# Patient Record
Sex: Female | Born: 1935 | Race: White | Hispanic: No | Smoking: Never smoker
Health system: Southern US, Community
[De-identification: ages and names within clinical notes are randomized; demographics above are authoritative.]

## PROBLEM LIST (undated history)

## (undated) DIAGNOSIS — N3020 Other chronic cystitis without hematuria: Secondary | ICD-10-CM

## (undated) DIAGNOSIS — M810 Age-related osteoporosis without current pathological fracture: Secondary | ICD-10-CM

## (undated) DIAGNOSIS — M5030 Other cervical disc degeneration, unspecified cervical region: Secondary | ICD-10-CM

## (undated) DIAGNOSIS — N289 Disorder of kidney and ureter, unspecified: Secondary | ICD-10-CM

## (undated) DIAGNOSIS — F419 Anxiety disorder, unspecified: Secondary | ICD-10-CM

## (undated) DIAGNOSIS — M170 Bilateral primary osteoarthritis of knee: Secondary | ICD-10-CM

## (undated) DIAGNOSIS — E785 Hyperlipidemia, unspecified: Secondary | ICD-10-CM

## (undated) DIAGNOSIS — E079 Disorder of thyroid, unspecified: Secondary | ICD-10-CM

## (undated) DIAGNOSIS — M329 Systemic lupus erythematosus, unspecified: Secondary | ICD-10-CM

## (undated) DIAGNOSIS — M797 Fibromyalgia: Secondary | ICD-10-CM

## (undated) DIAGNOSIS — M3500 Sicca syndrome, unspecified: Secondary | ICD-10-CM

## (undated) DIAGNOSIS — M436 Torticollis: Secondary | ICD-10-CM

## (undated) DIAGNOSIS — G459 Transient cerebral ischemic attack, unspecified: Secondary | ICD-10-CM

## (undated) DIAGNOSIS — I878 Other specified disorders of veins: Secondary | ICD-10-CM

## (undated) DIAGNOSIS — H409 Unspecified glaucoma: Secondary | ICD-10-CM

## (undated) DIAGNOSIS — J849 Interstitial pulmonary disease, unspecified: Secondary | ICD-10-CM

## (undated) DIAGNOSIS — K219 Gastro-esophageal reflux disease without esophagitis: Secondary | ICD-10-CM

## (undated) HISTORY — DX: Other chronic cystitis without hematuria: N30.20

## (undated) HISTORY — DX: Other specified disorders of veins: I87.8

## (undated) HISTORY — DX: Hyperlipidemia, unspecified: E78.5

## (undated) HISTORY — DX: Anxiety disorder, unspecified: F41.9

## (undated) HISTORY — DX: Age-related osteoporosis without current pathological fracture: M81.0

## (undated) HISTORY — DX: Unspecified glaucoma: H40.9

## (undated) HISTORY — DX: Fibromyalgia: M79.7

## (undated) HISTORY — DX: Disorder of thyroid, unspecified: E07.9

## (undated) HISTORY — DX: Bilateral primary osteoarthritis of knee: M17.0

## (undated) HISTORY — DX: Systemic lupus erythematosus, unspecified: M32.9

## (undated) HISTORY — DX: Interstitial pulmonary disease, unspecified: J84.9

## (undated) HISTORY — DX: Sjogren syndrome, unspecified: M35.00

## (undated) HISTORY — DX: Gastro-esophageal reflux disease without esophagitis: K21.9

## (undated) HISTORY — DX: Disorder of kidney and ureter, unspecified: N28.9

## (undated) HISTORY — DX: Other cervical disc degeneration, unspecified cervical region: M50.30

## (undated) HISTORY — DX: Transient cerebral ischemic attack, unspecified: G45.9

---

## 1980-04-28 HISTORY — PX: APPENDECTOMY: SHX54

## 1982-04-28 HISTORY — PX: ABDOMINAL HYSTERECTOMY: SHX81

## 1992-04-28 HISTORY — PX: LUMBAR DISC SURGERY: SHX700

## 2001-04-28 HISTORY — PX: LEEP: SHX91

## 2004-04-28 HISTORY — PX: TOTAL KNEE ARTHROPLASTY: SHX125

## 2008-02-11 ENCOUNTER — Encounter: Payer: Self-pay | Admitting: Internal Medicine

## 2008-02-27 ENCOUNTER — Encounter: Payer: Self-pay | Admitting: Internal Medicine

## 2009-04-28 HISTORY — PX: CYSTOSCOPY: SHX5120

## 2011-01-18 DIAGNOSIS — J984 Other disorders of lung: Secondary | ICD-10-CM | POA: Insufficient documentation

## 2011-01-18 DIAGNOSIS — M51369 Other intervertebral disc degeneration, lumbar region without mention of lumbar back pain or lower extremity pain: Secondary | ICD-10-CM | POA: Insufficient documentation

## 2011-01-18 DIAGNOSIS — M1990 Unspecified osteoarthritis, unspecified site: Secondary | ICD-10-CM | POA: Insufficient documentation

## 2011-01-18 DIAGNOSIS — M359 Systemic involvement of connective tissue, unspecified: Secondary | ICD-10-CM | POA: Insufficient documentation

## 2011-01-18 DIAGNOSIS — M3320 Polymyositis, organ involvement unspecified: Secondary | ICD-10-CM | POA: Insufficient documentation

## 2011-01-18 DIAGNOSIS — M5136 Other intervertebral disc degeneration, lumbar region: Secondary | ICD-10-CM | POA: Insufficient documentation

## 2011-12-23 DIAGNOSIS — M533 Sacrococcygeal disorders, not elsewhere classified: Secondary | ICD-10-CM | POA: Insufficient documentation

## 2012-03-04 DIAGNOSIS — N3020 Other chronic cystitis without hematuria: Secondary | ICD-10-CM | POA: Insufficient documentation

## 2012-03-04 DIAGNOSIS — R339 Retention of urine, unspecified: Secondary | ICD-10-CM | POA: Insufficient documentation

## 2012-03-08 ENCOUNTER — Ambulatory Visit: Payer: Self-pay | Admitting: Family Medicine

## 2012-04-01 ENCOUNTER — Ambulatory Visit: Payer: Self-pay | Admitting: Urology

## 2012-04-14 ENCOUNTER — Ambulatory Visit: Payer: Self-pay | Admitting: Family Medicine

## 2012-07-15 ENCOUNTER — Ambulatory Visit: Payer: Self-pay | Admitting: Family Medicine

## 2012-08-17 LAB — HM MAMMOGRAPHY: HM MAMMO: NEGATIVE

## 2013-05-19 ENCOUNTER — Observation Stay: Payer: Self-pay | Admitting: Internal Medicine

## 2013-05-19 LAB — CBC WITH DIFFERENTIAL/PLATELET
BASOS ABS: 0 10*3/uL (ref 0.0–0.1)
BASOS PCT: 0.5 %
EOS ABS: 0.3 10*3/uL (ref 0.0–0.7)
EOS PCT: 4.5 %
HCT: 39.2 % (ref 35.0–47.0)
HGB: 12.8 g/dL (ref 12.0–16.0)
Lymphocyte #: 1.9 10*3/uL (ref 1.0–3.6)
Lymphocyte %: 31.9 %
MCH: 31.3 pg (ref 26.0–34.0)
MCHC: 32.6 g/dL (ref 32.0–36.0)
MCV: 96 fL (ref 80–100)
MONO ABS: 0.4 x10 3/mm (ref 0.2–0.9)
Monocyte %: 7.6 %
NEUTROS PCT: 55.5 %
Neutrophil #: 3.3 10*3/uL (ref 1.4–6.5)
PLATELETS: 149 10*3/uL — AB (ref 150–440)
RBC: 4.08 10*6/uL (ref 3.80–5.20)
RDW: 13.8 % (ref 11.5–14.5)
WBC: 5.9 10*3/uL (ref 3.6–11.0)

## 2013-05-19 LAB — COMPREHENSIVE METABOLIC PANEL
ALBUMIN: 3.4 g/dL (ref 3.4–5.0)
ALT: 25 U/L (ref 12–78)
ANION GAP: 4 — AB (ref 7–16)
Alkaline Phosphatase: 109 U/L
BILIRUBIN TOTAL: 0.3 mg/dL (ref 0.2–1.0)
BUN: 34 mg/dL — ABNORMAL HIGH (ref 7–18)
CALCIUM: 8.8 mg/dL (ref 8.5–10.1)
CHLORIDE: 108 mmol/L — AB (ref 98–107)
CREATININE: 1 mg/dL (ref 0.60–1.30)
Co2: 29 mmol/L (ref 21–32)
EGFR (African American): >60
EGFR (Non-African Amer.): 55 — ABNORMAL LOW
Glucose: 75 mg/dL (ref 65–99)
Osmolality: 288 (ref 275–301)
Potassium: 4.3 mmol/L (ref 3.5–5.1)
SGOT(AST): 19 U/L (ref 15–37)
Sodium: 141 mmol/L (ref 136–145)
Total Protein: 6.7 g/dL (ref 6.4–8.2)

## 2013-05-19 LAB — CK-MB
CK-MB: 1.7 ng/mL (ref 0.5–3.6)
CK-MB: 1.8 ng/mL (ref 0.5–3.6)

## 2013-05-19 LAB — TROPONIN I: Troponin-I: <0.02 ng/mL

## 2013-05-20 LAB — BASIC METABOLIC PANEL
Anion Gap: 6 — ABNORMAL LOW (ref 7–16)
BUN: 30 mg/dL — ABNORMAL HIGH (ref 7–18)
CHLORIDE: 110 mmol/L — AB (ref 98–107)
CO2: 25 mmol/L (ref 21–32)
Calcium, Total: 8.6 mg/dL (ref 8.5–10.1)
Creatinine: 0.86 mg/dL (ref 0.60–1.30)
EGFR (African American): >60
GLUCOSE: 85 mg/dL (ref 65–99)
OSMOLALITY: 287 (ref 275–301)
Potassium: 3.5 mmol/L (ref 3.5–5.1)
Sodium: 141 mmol/L (ref 136–145)

## 2013-05-20 LAB — TSH: THYROID STIMULATING HORM: 0.76 u[IU]/mL

## 2013-05-20 LAB — HEMOGLOBIN A1C: HEMOGLOBIN A1C: 5.5 % (ref 4.2–6.3)

## 2013-05-20 LAB — LIPID PANEL
CHOLESTEROL: 156 mg/dL (ref 0–200)
HDL Cholesterol: 51 mg/dL (ref 40–60)
LDL CHOLESTEROL, CALC: 84 mg/dL (ref 0–100)
TRIGLYCERIDES: 105 mg/dL (ref 0–200)
VLDL Cholesterol, Calc: 21 mg/dL (ref 5–40)

## 2013-05-20 LAB — TROPONIN I: Troponin-I: <0.02 ng/mL

## 2013-05-20 LAB — CK-MB: CK-MB: 1.6 ng/mL (ref 0.5–3.6)

## 2013-05-21 LAB — LIPID PANEL
Cholesterol: 161 mg/dL (ref 0–200)
HDL Cholesterol: 53 mg/dL (ref 40–60)
Ldl Cholesterol, Calc: 95 mg/dL (ref 0–100)
Triglycerides: 67 mg/dL (ref 0–200)
VLDL CHOLESTEROL, CALC: 13 mg/dL (ref 5–40)

## 2013-07-08 DIAGNOSIS — M329 Systemic lupus erythematosus, unspecified: Secondary | ICD-10-CM | POA: Insufficient documentation

## 2013-07-08 DIAGNOSIS — M3500 Sicca syndrome, unspecified: Secondary | ICD-10-CM | POA: Insufficient documentation

## 2013-07-08 DIAGNOSIS — Z8673 Personal history of transient ischemic attack (TIA), and cerebral infarction without residual deficits: Secondary | ICD-10-CM | POA: Insufficient documentation

## 2013-07-08 DIAGNOSIS — N183 Chronic kidney disease, stage 3 unspecified: Secondary | ICD-10-CM | POA: Insufficient documentation

## 2013-07-18 LAB — LIPID PANEL
Cholesterol: 194 mg/dL (ref 0–200)
HDL: 69 mg/dL (ref 35–70)
LDL Cholesterol: 105 mg/dL
TRIGLYCERIDES: 99 mg/dL (ref 40–160)

## 2013-10-20 ENCOUNTER — Other Ambulatory Visit: Payer: Self-pay | Admitting: Physical Medicine and Rehabilitation

## 2013-10-20 DIAGNOSIS — M5414 Radiculopathy, thoracic region: Secondary | ICD-10-CM | POA: Insufficient documentation

## 2013-10-20 DIAGNOSIS — M7062 Trochanteric bursitis, left hip: Secondary | ICD-10-CM | POA: Insufficient documentation

## 2013-10-20 DIAGNOSIS — M545 Low back pain: Secondary | ICD-10-CM

## 2013-11-01 ENCOUNTER — Other Ambulatory Visit: Payer: Self-pay

## 2013-11-03 ENCOUNTER — Ambulatory Visit
Admission: RE | Admit: 2013-11-03 | Discharge: 2013-11-03 | Disposition: A | Discharge status: Home / Self Care | Payer: Medicare Other | Source: Ambulatory Visit | Attending: Physical Medicine and Rehabilitation | Admitting: Physical Medicine and Rehabilitation

## 2013-11-03 DIAGNOSIS — M545 Low back pain: Secondary | ICD-10-CM

## 2013-11-10 DIAGNOSIS — I779 Disorder of arteries and arterioles, unspecified: Secondary | ICD-10-CM | POA: Insufficient documentation

## 2014-06-26 DIAGNOSIS — I6529 Occlusion and stenosis of unspecified carotid artery: Secondary | ICD-10-CM | POA: Insufficient documentation

## 2014-06-26 DIAGNOSIS — R0681 Apnea, not elsewhere classified: Secondary | ICD-10-CM | POA: Insufficient documentation

## 2014-08-19 NOTE — H&P (Signed)
PATIENT NAME:  Neena RhymesDOLLAR, Santina MR#:  [id number] DATE OF BIRTH:  Mar 16, 1935  DATE OF ADMISSION:  05/19/2013  PRIMARY CARE PHYSICIAN: Dr. Lorie PhenixNancy Maloney.   REFERRING PHYSICIAN: Dr. Darnelle CatalanMalinda.   CHIEF COMPLAINT: Right facial droop.,   HISTORY OF PRESENT ILLNESS: Ms. Cleophas DunkerDollar is a 80 year old female with a history of Sjogren's disease, lupus, osteoarthritis. Was noted to have right facial droop when she woke up this morning. Denied having any dysarthria, difficulty swallowing, ptosis. Denied having any weakness in any part of the body. Concerning this, called the primary care physician and from there came to the Emergency Department. Workup in the Emergency Department, CT head without contrast no acute intracranial abnormality. Showed sinusitis. The patient denied previous history of any TIAs. The patient states symptoms are completely resolved at this time.   PAST MEDICAL HISTORY:  1. Sjogren's disease.  2. Glaucoma.  3. Osteoarthritis.  4. Lupus.   PAST SURGICAL HISTORY:  1. Hysterectomy.  2. Appendectomy.  3. Lower back surgery.  4. Bilateral knee replacement.   ALLERGIES:  1. SHELLFISH.  2. SULFA.  3. CORTISONE.  4. FLONASE.   HOME MEDICATIONS:  1. Xanax 0.5 mg once a day.  2. Vitamin D3 1000 units 2 times a day.  3. Vitamin B6 100 mg once a day.  4. Vitamin B 12,000 mcg once a day.  5. Tylenol Arthritis 2 tablets every 8 hours.  6. Tramadol 50 mg 1 tablet every 4 to 6 hours as needed.  7. Synthroid 88 mcg once a day.  8. Spironolactone 50 mg once a day.  9. Plaquenil 200 mg 2 tablets once a day.  10. Omega-3 fatty acids 2 times a day.  11. Nexium 40 mg once a day.  12. Multivitamin 1 tablet once a day.  13. Lexapro 5 mg once a day.  14. Latanoprost 1 drop once a day.  15. Klonopin 0.5 mg 2 times a day.  16. Glucosamine chondroitin 1 tablet 2 times a day.  17.  1 drop eye.  18. Doxepin 50 mg a day.  19. Corgard 20 mg 2 times a day.  20. Coenzyme Q orally once a day.  21.  Calcium with vitamin D 2 times a day.  22. Biotin 5000 mcg orally.  23. Aspirin 81 mg daily.  24. Acidophilus probiotic once a day.   SOCIAL HISTORY: No history of smoking, drinking alcohol or using illicit drugs. Married, lives with her husband.   FAMILY HISTORY: Osteoarthritis, CVA and hypertension in mother. Father had hypertension,   REVIEW OF SYSTEMS:  CONSTITUTIONAL: Denies generalized weakness.  EYES: No change in vision.  EARS, NOSE, THROAT: Has sinus congestion. No sore throat.  RESPIRATORY: No cough, shortness of breath.  CARDIOVASCULAR: No chest pain, palpitations.  GASTROINTESTINAL: No nausea, vomiting, abdominal pain.  GENITOURINARY: No dysuria or hematuria.   ENDOCRINE: No polyuria or polydipsia.  HEMATOLOGIC: No easy bruising or bleeding.  MUSCULOSKELETAL: Has osteoarthritis.  NEUROLOGIC: No weakness at present, numbness in any part of the body.   PHYSICAL EXAMINATION:  GENERAL: This is a well-built, well-nourished, age-appropriate female lying down in the bed, not in distress.  VITAL SIGNS: Temperature 98.2, pulse 73, blood pressure 131/58, respiratory rate of 16, oxygen saturation is 95% on room air.  HEENT: Head normocephalic, atraumatic. There is no scleral icterus. Conjunctivae normal. Pupils equal and reactive to light. Extraocular movements are intact. Mucous membranes moist. No pharyngeal erythema.  NECK: Supple. No lymphadenopathy. No JVD. No carotid bruit. No thyromegaly.  CHEST: Has  no focal tenderness.  LUNGS: Bilaterally clear to auscultation.  HEART: S1, S2 regular. No murmurs are heard.  ABDOMEN: Bowel sounds present. Soft, nontender, nondistended. No hepatosplenomegaly.  EXTREMITIES: No pedal edema. Pulses 2+.  NEUROLOGIC: The patient is alert, oriented to place, person and time. Cranial nerves II through XII intact. Motor 5/5 in upper and lower extremities.  SKIN: No rash or lesions.  MUSCULOSKELETAL: Has osteoarthritis.   LABORATORIES: CBC, CMP are  completely within normal limits.   CT head without contrast: No acute intracranial abnormality. Complete opacification of the sphenoid sinus.   ASSESSMENT AND PLAN: Ms. Belles is a 80 year old female who comes to the Emergency Department with right facial droop,  1. Transient ischemic attack: The patient has risk factors of age, systemic inflammatory disease. Admit the patient to a monitored bed under observation. Obtain MRI of the brain, carotid Dopplers and echocardiogram. The patient is on aspirin 81 mg daily. Will keep the patient on 325 mg daily.  2. Sjogren's disease: Will continue with the Plaquenil.  3. Sinusitis: The patient does not have any fever. No current indication for any antibiotics.  4. Hypothyroidism: Continue Synthroid.  5. Keep the patient on deep vein thrombosis prophylaxis with Lovenox.   TIME SPENT: 40 minutes.    ____________________________ Susa Griffins, MD pv:gb D: 05/19/2013 21:41:31 ET T: 05/19/2013 22:30:05 ET JOB#: [id number]  cc: Susa Griffins, MD, <Dictator> Leo Grosser, MD Susa Griffins MD ELECTRONICALLY SIGNED 05/22/2013 21:26

## 2014-08-19 NOTE — Discharge Summary (Signed)
PATIENT NAME:  Mckenzie Wiley, Mckenzie Wiley MR#:  [id number] DATE OF BIRTH:  [birth year]  DATE OF ADMISSION:  05/19/2013 DATE OF DISCHARGE:  05/21/2013  DISCHARGE DIAGNOSES: 1.  Facial droop, resolved.  2.  Hypertension.  3.  Sjogren's syndrome.   4.  Depression.  5.  Anxiety.   DISCHARGE MEDICATIONS: 1.  Lexapro 10 mg half tablet daily.  2.  Plaquenil 200 mg 2 tablets once a day.  3.  Synthroid 88 mcg p.o. daily.  4.  Corgard 20 mg p.o. b.i.d.  5.  Aldactone 50 mg p.o. daily.  6.  Tramadol 50 mg p.o. every 4 to 6 hours for pain.  7.  Klonopin 0.5 mg p.o. b.i.d.  8.  Xanax 0.5 mg once a day.  9.   doxepin 50mg   once a day.  10.  Nexium 40 mg p.o. daily.  11.  Latanoprost eye drop 0.05% 1 drop in each eye.  12.  Calcium with vitamin D 1 tablet p.o. b.i.d.  13.  Acidophilus probiotic 1 capsule p.o. daily.  14.  Glucosamine/chondroitin sulfate 1 tablet p.o. b.i.d.  15.  Vitamin B6, 100 mg p.o. daily.  16.  Omega-3 polyunsaturated fat 1 capsule p.o. b.i.d.  17.  Simvastatin 10 mg p.o. daily; it is a new medication.  18.  She is also on PreserVision eye drops, 1 drop 4 times a day.   DIET: Low-sodium, low-fat diet.   CONSULTATIONS: None.   HOSPITAL COURSE:  1.  Facial droop. A 80 year old female patient admitted on 22nd of January because of her right facial droop. The patient did not have dysarthria or difficulty swallowing. Has a history of Sjogren's syndrome, lupus and osteoarthritis. The patient noticed the facial droop when she woke up in the morning. Look at the history and physical for full details. CT head did not show any acute changes. The patient admitted to observation status for possible TIA. The patient's blood pressure on admission 131/58, and neurological exam was within normal limits. The patient was admitted to telemetry. She had echocardiogram, carotid ultrasound, MRI of the brain. The patient was started on aspirin 325 mg p.o. daily. Her troponins have been negative. MRA of the  brain did not show any acute changes, and ultrasound of carotids showed minor carotid atherosclerotic  changes, but no significant ICA stenosis. Echocardiogram showed EF of 55% to 60% with normal systolic function. The patient's LDL is 95. The patient's other labs are within normal range. Because of her facial droop, which resolved, we started her on cholesterol medication, also because of her carotid plaque, so she is on aspirin and statins and neurological exam was within normal limits at the time of discharge.  2.  Hypertension. Blood pressure is stable, and the patient takes Coreg and also spironolactone which we continued.  3.  The patient has a history of anxiety, hypothyroidism. We have continued all of her medications for her.   TIME SPENT ON DISCHARGE PREPARATION: More than 30 minutes.   The patient can follow up with Dr. Lorie PhenixNancy Maloney.     ____________________________ Katha HammingSnehalatha Nasri Boakye, MD sk:dmm D: 05/23/2013 10:34:00 ET T: 05/23/2013 11:57:26 ET JOB#: [id number]  cc: Katha HammingSnehalatha Kiondra Caicedo, MD, <Dictator> Katha HammingSNEHALATHA Treniece Holsclaw MD ELECTRONICALLY SIGNED 06/06/2013 13:12

## 2014-09-04 ENCOUNTER — Ambulatory Visit
Admission: RE | Admit: 2014-09-04 | Discharge: 2014-09-04 | Disposition: A | Discharge status: Home / Self Care | Payer: Medicare Other | Source: Ambulatory Visit | Attending: Family Medicine | Admitting: Family Medicine

## 2014-09-04 ENCOUNTER — Other Ambulatory Visit: Payer: Self-pay | Admitting: Family Medicine

## 2014-09-04 DIAGNOSIS — R05 Cough: Secondary | ICD-10-CM | POA: Insufficient documentation

## 2014-09-04 DIAGNOSIS — R059 Cough, unspecified: Secondary | ICD-10-CM

## 2014-10-19 ENCOUNTER — Other Ambulatory Visit: Payer: Self-pay

## 2014-10-19 MED ORDER — ESCITALOPRAM OXALATE 10 MG PO TABS: ORAL_TABLET | ORAL | Status: DC

## 2014-10-20 ENCOUNTER — Other Ambulatory Visit: Payer: Self-pay | Admitting: *Deleted

## 2014-10-20 MED ORDER — OMEPRAZOLE 20 MG PO CPDR: 20.0000 mg | DELAYED_RELEASE_CAPSULE | Freq: Every day | ORAL | Status: DC

## 2014-10-20 NOTE — Telephone Encounter (Signed)
Refill request for Omeprazole 20 mg This med is not in [facility]. Last Appt: 09/04/2014 Next Appt: none Please advise refill?

## 2014-10-24 ENCOUNTER — Telehealth: Payer: Self-pay | Admitting: Family Medicine

## 2014-10-24 ENCOUNTER — Other Ambulatory Visit: Payer: Self-pay | Admitting: Family Medicine

## 2014-10-24 DIAGNOSIS — F419 Anxiety disorder, unspecified: Secondary | ICD-10-CM | POA: Insufficient documentation

## 2014-10-24 MED ORDER — CLONAZEPAM 0.5 MG PO TABS: 0.5000 mg | ORAL_TABLET | Freq: Two times a day (BID) | ORAL | Status: DC | PRN

## 2014-10-24 NOTE — Telephone Encounter (Signed)
Please call in rx.  Thanks.  

## 2014-11-16 ENCOUNTER — Other Ambulatory Visit: Payer: Self-pay | Admitting: Family Medicine

## 2014-11-16 DIAGNOSIS — I10 Essential (primary) hypertension: Secondary | ICD-10-CM | POA: Insufficient documentation

## 2014-11-28 ENCOUNTER — Telehealth: Payer: Self-pay | Admitting: Family Medicine

## 2014-11-28 DIAGNOSIS — F419 Anxiety disorder, unspecified: Secondary | ICD-10-CM

## 2014-11-28 MED ORDER — CLONAZEPAM 0.5 MG PO TABS: 0.5000 mg | ORAL_TABLET | Freq: Two times a day (BID) | ORAL | Status: DC | PRN

## 2014-11-28 NOTE — Telephone Encounter (Signed)
Patient called wanting to know why her prescription was changed for her Klonopin.  She was taking two time dailey but changed to once a day.   Please call her back.  Thanks, Barth Kirks

## 2014-11-28 NOTE — Telephone Encounter (Signed)
Ok to call in 70. Thanks.

## 2014-11-28 NOTE — Telephone Encounter (Signed)
Called in medication as below. Advised patient.

## 2014-12-19 ENCOUNTER — Other Ambulatory Visit: Payer: Self-pay | Admitting: Family Medicine

## 2014-12-19 DIAGNOSIS — I10 Essential (primary) hypertension: Secondary | ICD-10-CM

## 2014-12-19 NOTE — Telephone Encounter (Signed)
Last OV 08/2014  Thanks,   -Ancil Dewan  

## 2015-01-12 ENCOUNTER — Encounter: Payer: Self-pay | Admitting: Family Medicine

## 2015-01-17 ENCOUNTER — Other Ambulatory Visit: Payer: Self-pay | Admitting: Family Medicine

## 2015-01-17 DIAGNOSIS — E7800 Pure hypercholesterolemia, unspecified: Secondary | ICD-10-CM | POA: Insufficient documentation

## 2015-01-17 DIAGNOSIS — M5030 Other cervical disc degeneration, unspecified cervical region: Secondary | ICD-10-CM | POA: Insufficient documentation

## 2015-01-17 DIAGNOSIS — E039 Hypothyroidism, unspecified: Secondary | ICD-10-CM | POA: Insufficient documentation

## 2015-01-17 NOTE — Telephone Encounter (Signed)
Last OV 08/2014  Thanks,   -Laura  

## 2015-01-18 ENCOUNTER — Other Ambulatory Visit: Payer: Self-pay | Admitting: *Deleted

## 2015-01-18 DIAGNOSIS — K219 Gastro-esophageal reflux disease without esophagitis: Secondary | ICD-10-CM | POA: Insufficient documentation

## 2015-01-18 MED ORDER — OMEPRAZOLE 20 MG PO CPDR: 20.0000 mg | DELAYED_RELEASE_CAPSULE | Freq: Every day | ORAL | Status: DC

## 2015-01-18 NOTE — Telephone Encounter (Signed)
Last 09/04/2014   Thanks,   Vernona Rieger

## 2015-01-24 ENCOUNTER — Other Ambulatory Visit: Payer: Self-pay

## 2015-01-24 DIAGNOSIS — K219 Gastro-esophageal reflux disease without esophagitis: Secondary | ICD-10-CM

## 2015-02-09 DIAGNOSIS — R002 Palpitations: Secondary | ICD-10-CM | POA: Insufficient documentation

## 2015-02-15 ENCOUNTER — Other Ambulatory Visit: Payer: Self-pay | Admitting: Family Medicine

## 2015-02-15 DIAGNOSIS — K219 Gastro-esophageal reflux disease without esophagitis: Secondary | ICD-10-CM

## 2015-02-15 NOTE — Telephone Encounter (Signed)
Last ov 08/2014  Thanks,   -Korah Hufstedler  

## 2015-04-07 ENCOUNTER — Other Ambulatory Visit: Payer: Self-pay | Admitting: Family Medicine

## 2015-04-07 DIAGNOSIS — F419 Anxiety disorder, unspecified: Secondary | ICD-10-CM

## 2015-04-09 NOTE — Telephone Encounter (Signed)
Printed, please fax or call in to pharmacy. Thank you.   

## 2015-05-17 ENCOUNTER — Other Ambulatory Visit: Payer: Self-pay | Admitting: Family Medicine

## 2015-05-17 DIAGNOSIS — F419 Anxiety disorder, unspecified: Secondary | ICD-10-CM

## 2015-05-17 DIAGNOSIS — I10 Essential (primary) hypertension: Secondary | ICD-10-CM

## 2015-05-18 NOTE — Telephone Encounter (Signed)
Printed, please fax or call in to pharmacy. Thank you.   

## 2015-05-18 NOTE — Telephone Encounter (Signed)
RX called in at [facility].

## 2015-05-23 ENCOUNTER — Ambulatory Visit (INDEPENDENT_AMBULATORY_CARE_PROVIDER_SITE_OTHER): Payer: Medicare Other | Admitting: Physician Assistant

## 2015-05-23 ENCOUNTER — Encounter: Payer: Self-pay | Admitting: Physician Assistant

## 2015-05-23 VITALS — BP 120/66 | HR 80 | Temp 98.0°F | Resp 16 | Wt 195.2 lb

## 2015-05-23 DIAGNOSIS — I878 Other specified disorders of veins: Secondary | ICD-10-CM | POA: Insufficient documentation

## 2015-05-23 DIAGNOSIS — L309 Dermatitis, unspecified: Secondary | ICD-10-CM | POA: Insufficient documentation

## 2015-05-23 DIAGNOSIS — L03019 Cellulitis of unspecified finger: Secondary | ICD-10-CM | POA: Insufficient documentation

## 2015-05-23 DIAGNOSIS — F329 Major depressive disorder, single episode, unspecified: Secondary | ICD-10-CM | POA: Insufficient documentation

## 2015-05-23 DIAGNOSIS — N390 Urinary tract infection, site not specified: Secondary | ICD-10-CM

## 2015-05-23 DIAGNOSIS — F32A Depression, unspecified: Secondary | ICD-10-CM | POA: Insufficient documentation

## 2015-05-23 DIAGNOSIS — R011 Cardiac murmur, unspecified: Secondary | ICD-10-CM

## 2015-05-23 DIAGNOSIS — J0141 Acute recurrent pansinusitis: Secondary | ICD-10-CM | POA: Diagnosis not present

## 2015-05-23 DIAGNOSIS — M3500 Sicca syndrome, unspecified: Secondary | ICD-10-CM | POA: Insufficient documentation

## 2015-05-23 DIAGNOSIS — H409 Unspecified glaucoma: Secondary | ICD-10-CM | POA: Insufficient documentation

## 2015-05-23 DIAGNOSIS — IMO0002 Reserved for concepts with insufficient information to code with codable children: Secondary | ICD-10-CM | POA: Insufficient documentation

## 2015-05-23 LAB — POCT URINALYSIS DIPSTICK
Bilirubin, UA: NEGATIVE
Blood, UA: NEGATIVE
Glucose, UA: NEGATIVE
Ketones, UA: NEGATIVE
LEUKOCYTES UA: NEGATIVE
NITRITE UA: NEGATIVE
PH UA: 5
PROTEIN UA: NEGATIVE
Spec Grav, UA: >=1.03
UROBILINOGEN UA: 0.2

## 2015-05-23 MED ORDER — AMOXICILLIN-POT CLAVULANATE 875-125 MG PO TABS: 1.0000 | ORAL_TABLET | Freq: Two times a day (BID) | ORAL | Status: DC

## 2015-05-23 NOTE — Patient Instructions (Signed)
Sinusitis, Adult Sinusitis is redness, soreness, and inflammation of the paranasal sinuses. Paranasal sinuses are air pockets within the bones of your face. They are located beneath your eyes, in the middle of your forehead, and above your eyes. In healthy paranasal sinuses, mucus is able to drain out, and air is able to circulate through them by way of your nose. However, when your paranasal sinuses are inflamed, mucus and air can become trapped. This can allow bacteria and other germs to grow and cause infection. Sinusitis can develop quickly and last only a short time (acute) or continue over a long period (chronic). Sinusitis that lasts for more than 12 weeks is considered chronic. CAUSES Causes of sinusitis include:  Allergies.  Structural abnormalities, such as displacement of the cartilage that separates your nostrils (deviated septum), which can decrease the air flow through your nose and sinuses and affect sinus drainage.  Functional abnormalities, such as when the small hairs (cilia) that line your sinuses and help remove mucus do not work properly or are not present. SIGNS AND SYMPTOMS Symptoms of acute and chronic sinusitis are the same. The primary symptoms are pain and pressure around the affected sinuses. Other symptoms include:  Upper toothache.  Earache.  Headache.  Bad breath.  Decreased sense of smell and taste.  A cough, which worsens when you are lying flat.  Fatigue.  Fever.  Thick drainage from your nose, which often is green and may contain pus (purulent).  Swelling and warmth over the affected sinuses. DIAGNOSIS Your health care provider will perform a physical exam. During your exam, your health care provider may perform any of the following to help determine if you have acute sinusitis or chronic sinusitis:  Look in your nose for signs of abnormal growths in your nostrils (nasal polyps).  Tap over the affected sinus to check for signs of  infection.  View the inside of your sinuses using an imaging device that has a light attached (endoscope). If your health care provider suspects that you have chronic sinusitis, one or more of the following tests may be recommended:  Allergy tests.  Nasal culture. A sample of mucus is taken from your nose, sent to a lab, and screened for bacteria.  Nasal cytology. A sample of mucus is taken from your nose and examined by your health care provider to determine if your sinusitis is related to an allergy. TREATMENT Most cases of acute sinusitis are related to a viral infection and will resolve on their own within 10 days. Sometimes, medicines are prescribed to help relieve symptoms of both acute and chronic sinusitis. These may include pain medicines, decongestants, nasal steroid sprays, or saline sprays. However, for sinusitis related to a bacterial infection, your health care provider will prescribe antibiotic medicines. These are medicines that will help kill the bacteria causing the infection. Rarely, sinusitis is caused by a fungal infection. In these cases, your health care provider will prescribe antifungal medicine. For some cases of chronic sinusitis, surgery is needed. Generally, these are cases in which sinusitis recurs more than 3 times per year, despite other treatments. HOME CARE INSTRUCTIONS  Drink plenty of water. Water helps thin the mucus so your sinuses can drain more easily.  Use a humidifier.  Inhale steam 3-4 times a day (for example, sit in the bathroom with the shower running).  Apply a warm, moist washcloth to your face 3-4 times a day, or as directed by your health care provider.  Use saline nasal sprays to help   moisten and clean your sinuses.  Take medicines only as directed by your health care provider.  If you were prescribed either an antibiotic or antifungal medicine, finish it all even if you start to feel better. SEEK IMMEDIATE MEDICAL CARE IF:  You have  increasing pain or severe headaches.  You have nausea, vomiting, or drowsiness.  You have swelling around your face.  You have vision problems.  You have a stiff neck.  You have difficulty breathing.   This information is not intended to replace advice given to you by your health care provider. Make sure you discuss any questions you have with your health care provider.   Document Released: 04/14/2005 Document Revised: 05/05/2014 Document Reviewed: 04/29/2011 Elsevier Interactive Patient Education 2016 [employer]  Aortic Stenosis Aortic stenosis is a narrowing of the aortic valve. The aortic valve is a gate-like structure that is located between the lower left chamber of the heart (left ventricle) and the blood vessel that leads away from the heart (aorta). When the aortic valve is narrowed, it does not open all the way. This makes it hard for the heart to pump blood into the aorta and causes the heart to work harder. The extra work can weaken the heart over time and lead to heart failure. CAUSES  Causes of aortic stenosis include:  Calcium deposits on the aortic valve that have made the valve stiff. This condition generally affects those over the age of 19. It is the most common cause of aortic stenosis.  A birth defect.  Rheumatic fever. This is a problem that may occur after a strep throat infection that was not treated adequately. Rheumatic fever can cause permanent damage to heart valves. SIGNS AND SYMPTOMS  People with aortic stenosis usually have no symptoms until the condition becomes severe. It may take 10-20 years for mild or moderate aortic stenosis to become severe. Symptoms may include:   Shortness of breath, especially with physical activity.   Feeling weak and tired (fatigued) or getting tired easily.  Chest discomfort (angina). This may occur with minimal activity if the aortic stenosis is severe.  An irregular or faster-than-normal heartbeat.  Dizziness  or fainting that happens with exertion or after taking certain heart medicines (such as nitroglycerin). DIAGNOSIS  Aortic stenosis is usually diagnosed with a physical exam and with a type of imaging test called echocardiography. During echocardiography, sound waves are used to evaluate how blood flows through the heart. If your health care provider suspects aortic stenosis but the test does not clearly show it, a procedure called cardiac catheterization may be done to diagnose the condition. Tests may also be done to evaluate heart function. They may include:  Electrocardiography. During this test, the electrical impulses of the heart are recorded while you are lying down and sticky patches are placed on your chest, arms, and legs.  Stress tests. There is more than one type of stress test. If a stress test is needed, ask your health care provider about which type is best for you.  Blood tests. TREATMENT  Treatment depends on how severe the aortic stenosis is, your symptoms, and the problems it is causing.   Observation. If the aortic stenosis is mild, no treatment may be needed. However, you will need to have the condition checked regularly to make sure it is not getting worse or causing serious problems.  Surgery. Surgery to repair or replace the aortic valve is the most common treatment for aortic stenosis. Several types of surgeries are  available. The most common are open-heart surgery and transcutaneous aortic valve replacement (TAVR). TAVR does not require that the chest be opened. It is usually performed on elderly patients and those who are not able to have open-heart surgery.  Medicines. Medicines may be given to keep symptoms from getting worse. Medicines cannot reverse aortic stenosis. HOME CARE INSTRUCTIONS   You may need to avoid certain types of physical activity. If your aortic stenosis is mild, you may need to avoid only strenuous activity. The more severe your aortic stenosis, the  more activities you will need to avoid. Talk with your health care provider about the types of activity you should avoid.  Take medicines only as directed by your health care provider.  If you are a woman with aortic valve stenosis and want to get pregnant, talk to your health care provider before you become pregnant.  If you are a woman with aortic valve stenosis and are pregnant, keep all follow-up visits with all recommended health care providers.  Keep all follow-up visits for tests, exams, and treatments as directed by your health care provider. SEEK IMMEDIATE MEDICAL CARE IF:  You develop chest pain or tightness.   You develop shortness of breath or difficulty breathing.   You develop light-headedness or faint.   It feels like your heartbeat is irregular or faster than normal.  You have a fever.   This information is not intended to replace advice given to you by your health care provider. Make sure you discuss any questions you have with your health care provider.   Document Released: 01/11/2003 Document Revised: 01/03/2015 Document Reviewed: 04/08/2012 Elsevier Interactive Patient Education [employer]

## 2015-05-23 NOTE — Progress Notes (Signed)
Patient: Mckenzie Wiley Female    DOB: January 23, 1935   81 y.o.   MRN: [id number] Visit Date: 05/23/2015  Today's Provider: Margaretann Loveless, PA-C   Chief Complaint  Patient presents with  . URI   Subjective:    URI  This is a new problem. The current episode started 1 to 4 weeks ago. There has been no fever. Associated symptoms include congestion, coughing, headaches, rhinorrhea, sinus pain, sneezing, a sore throat and wheezing. Pertinent negatives include no chest pain or nausea. She has tried nothing for the symptoms.  Urinary Tract Infection  This is a new problem. The current episode started 1 to 4 weeks ago. The problem has been gradually worsening. There has been no fever. She is not sexually active. Associated symptoms include chills and hesitancy. Pertinent negatives include no frequency, hematuria, nausea or urgency. She has tried antibiotics (Cipro) for the symptoms. The treatment provided no relief.  Patient has an appointment with the Urologist February the 3rd for frequent UTI.     Allergies  Allergen Reactions  . Cortisone     Other reaction(s): Other (See Comments) Other Reaction: OCULAR PRESSURE INCREASES  . Loteprednol Etabonate     Other reaction(s): Other (See Comments) Burned eyes and eye lids Other reaction(s): EYE IRRITATION Other reaction(s): Other (See Comments) Burned eyes and eye lids  . Sulfur Swelling    Also nausea  . Fluticasone     Other reaction(s): Other (See Comments) Other Reaction: raised pressure of eyes  . Lovastatin     Other reaction(s): Muscle Pain Aches   . Montelukast     Other reaction(s): Other (See Comments) Other Reaction: TACHYCARDIA  . Olopatadine     Other reaction(s): Other (See Comments) Other Reaction: VERY DRY EYES  . Prednisone     Other reaction(s): Other (See Comments) Other Reaction: INCREASED OCULAR PRESSURE  . Shellfish Allergy     swelling and nausea  . Simvastatin     Other reaction(s): Muscle  Pain aches  . Sulfa Antibiotics     swollen face  . Codeine Nausea Only   Previous Medications   ACETAMINOPHEN (TYLENOL) 500 MG TABLET    Take 650 mg by mouth.   ALPRAZOLAM (XANAX) 0.5 MG TABLET    TAKE 1 TABLET BY MOUTH 3 TIMES DAILY AS NEEDED. **SANDOZ ONLY**   ASPIRIN 325 MG TABLET    Take by mouth.   BIOTIN (SUPER BIOTIN) 5 MG TABS    Take by mouth.   CALCIUM CARBONATE-VITAMIN D 600-200 MG-UNIT CAPS    Take by mouth.   CLONAZEPAM (KLONOPIN) 0.5 MG TABLET    TAKE 1 TABLET BY MOUTH TWICE DAILY AS NEEDED FOR ANXIETY   COENZYME Q10 (CO Q 10) 100 MG CAPS    Take by mouth.   CYANOCOBALAMIN 100 MCG TABLET    Take by mouth.   DOXEPIN (SINEQUAN) 50 MG CAPSULE    Take by mouth.   ESCITALOPRAM (LEXAPRO) 10 MG TABLET    2 tablets daily   ESOMEPRAZOLE (NEXIUM) 40 MG PACKET    Take by mouth.   HYDROXYCHLOROQUINE (PLAQUENIL) 200 MG TABLET    Take by mouth.   LACTOBACILLUS (PROBIOTIC ACIDOPHILUS) TABS    Take by mouth.   LATANOPROST (XALATAN) 0.005 % OPHTHALMIC SOLUTION    Apply to eye.   LEVOTHYROXINE (SYNTHROID, LEVOTHROID) 88 MCG TABLET    TAKE 1 TABLET BY MOUTH ONCE DAILY ON AN EMPTY STOMACH. WAIT 30 MINUTES BEFORE TAKING OTHER MEDS.  OMEPRAZOLE (PRILOSEC) 20 MG CAPSULE    TAKE ONE CAPSULE BY MOUTH ONCE A DAY.   PRAVASTATIN (PRAVACHOL) 10 MG TABLET       PROPRANOLOL (INDERAL) 20 MG TABLET    TAKE 1 TABLET BY MOUTH TWICE DAILY.   PYRIDOXINE (VITAMIN B-6) 100 MG TABLET    Take by mouth.    Review of Systems  Constitutional: Positive for chills.  HENT: Positive for congestion, nosebleeds, postnasal drip, rhinorrhea, sinus pressure, sneezing and sore throat.   Respiratory: Positive for cough, chest tightness, shortness of breath and wheezing.   Cardiovascular: Positive for palpitations. Negative for chest pain and leg swelling.  Gastrointestinal: Negative for nausea.  Genitourinary: Positive for hesitancy. Negative for urgency, frequency and hematuria.  Neurological: Positive for headaches.  Negative for dizziness.    Social History  Substance Use Topics  . Smoking status: Never Smoker   . Smokeless tobacco: Not on file  . Alcohol Use: No   Objective:   BP 120/66 mmHg  Pulse 80  Temp(Src) 98 F (36.7 C) (Oral)  Resp 16  Wt 195 lb 3.2 oz (88.542 kg)  SpO2 95%  Physical Exam  Constitutional: She appears well-developed and well-nourished. No distress.  HENT:  Head: Normocephalic and atraumatic.  Right Ear: Hearing, tympanic membrane, external ear and ear canal normal.  Left Ear: Hearing, tympanic membrane, external ear and ear canal normal.  Nose: Mucosal edema and rhinorrhea present. Right sinus exhibits maxillary sinus tenderness and frontal sinus tenderness. Left sinus exhibits maxillary sinus tenderness and frontal sinus tenderness.  Mouth/Throat: Uvula is midline, oropharynx is clear and moist and mucous membranes are normal. No oropharyngeal exudate, posterior oropharyngeal edema or posterior oropharyngeal erythema.  Eyes: Conjunctivae are normal. Pupils are equal, round, and reactive to light. Right eye exhibits no discharge. Left eye exhibits no discharge. No scleral icterus.  Neck: Normal range of motion. Neck supple. No tracheal deviation present. No thyromegaly present.  Cardiovascular: Normal rate and regular rhythm.  Exam reveals no gallop and no friction rub.   Murmur heard.  Systolic murmur is present with a grade of 2/6  Heard best over right second intercostal space.  Pulmonary/Chest: Effort normal and breath sounds normal. No stridor. No respiratory distress. She has no wheezes. She has no rales.  Abdominal: Soft. Bowel sounds are normal. She exhibits no distension and no mass. There is no tenderness. There is no rebound and no guarding.  Lymphadenopathy:    She has no cervical adenopathy.  Skin: Skin is warm and dry. She is not diaphoretic.  Vitals reviewed.       Assessment & Plan:     1. Urinary tract infection without hematuria, site  unspecified UA was negative for UTI. She is to keep her appointment with urology on February 3 for further evaluation. - POCT urinalysis dipstick  2. Undiagnosed cardiac murmurs On exam she did have a murmur best heard over the right second intercostal space. This is most likely aortic stenosis however she states she has never been told she has a murmur. It is most likely benign but I did advise her that it would be best to have a cardiac evaluation to rule out any worse cause. - Ambulatory referral to Cardiology  3. Acute recurrent pansinusitis Worsening symptoms that have failed over-the-counter treatment. I will treat with Augmentin as below. She needs to make sure to stay well-hydrated and get plenty of rest. She is to call the office if symptoms felt to improve or worsen. - amoxicillin-clavulanate (  AUGMENTIN) 875-125 MG tablet; Take 1 tablet by mouth 2 (two) times daily.  Dispense: 20 tablet; Refill: 0       Margaretann Loveless, PA-C  [facility]

## 2015-06-18 ENCOUNTER — Other Ambulatory Visit: Payer: Self-pay | Admitting: Family Medicine

## 2015-06-26 DIAGNOSIS — Z9660 Presence of unspecified orthopedic joint implant: Secondary | ICD-10-CM | POA: Insufficient documentation

## 2015-06-26 DIAGNOSIS — M797 Fibromyalgia: Secondary | ICD-10-CM | POA: Insufficient documentation

## 2015-06-26 DIAGNOSIS — B356 Tinea cruris: Secondary | ICD-10-CM | POA: Insufficient documentation

## 2015-06-26 DIAGNOSIS — E669 Obesity, unspecified: Secondary | ICD-10-CM | POA: Insufficient documentation

## 2015-06-26 DIAGNOSIS — Z13820 Encounter for screening for osteoporosis: Secondary | ICD-10-CM | POA: Insufficient documentation

## 2015-06-26 DIAGNOSIS — J849 Interstitial pulmonary disease, unspecified: Secondary | ICD-10-CM | POA: Insufficient documentation

## 2015-06-26 DIAGNOSIS — M810 Age-related osteoporosis without current pathological fracture: Secondary | ICD-10-CM | POA: Insufficient documentation

## 2015-06-26 DIAGNOSIS — N289 Disorder of kidney and ureter, unspecified: Secondary | ICD-10-CM | POA: Insufficient documentation

## 2015-06-26 DIAGNOSIS — IMO0001 Reserved for inherently not codable concepts without codable children: Secondary | ICD-10-CM | POA: Insufficient documentation

## 2015-06-26 DIAGNOSIS — N3010 Interstitial cystitis (chronic) without hematuria: Secondary | ICD-10-CM | POA: Insufficient documentation

## 2015-07-18 ENCOUNTER — Other Ambulatory Visit: Payer: Self-pay | Admitting: Family Medicine

## 2015-07-18 DIAGNOSIS — F419 Anxiety disorder, unspecified: Secondary | ICD-10-CM

## 2015-07-19 NOTE — Telephone Encounter (Signed)
Printed, please fax or call in to pharmacy. Thank you.   

## 2015-08-06 ENCOUNTER — Other Ambulatory Visit: Payer: Self-pay | Admitting: Family Medicine

## 2015-08-06 ENCOUNTER — Ambulatory Visit (INDEPENDENT_AMBULATORY_CARE_PROVIDER_SITE_OTHER): Payer: Medicare Other | Admitting: Family Medicine

## 2015-08-06 ENCOUNTER — Encounter: Payer: Self-pay | Admitting: Family Medicine

## 2015-08-06 VITALS — BP 120/62 | HR 80 | Temp 98.3°F | Resp 16 | Wt 192.0 lb

## 2015-08-06 DIAGNOSIS — R229 Localized swelling, mass and lump, unspecified: Secondary | ICD-10-CM

## 2015-08-06 DIAGNOSIS — N3010 Interstitial cystitis (chronic) without hematuria: Secondary | ICD-10-CM | POA: Diagnosis not present

## 2015-08-06 DIAGNOSIS — N3020 Other chronic cystitis without hematuria: Secondary | ICD-10-CM

## 2015-08-06 DIAGNOSIS — N3001 Acute cystitis with hematuria: Secondary | ICD-10-CM | POA: Diagnosis not present

## 2015-08-06 DIAGNOSIS — N183 Chronic kidney disease, stage 3 unspecified: Secondary | ICD-10-CM

## 2015-08-06 LAB — POCT URINALYSIS DIPSTICK
BILIRUBIN UA: NEGATIVE
Glucose, UA: NEGATIVE
KETONES UA: NEGATIVE
Nitrite, UA: POSITIVE
PH UA: 5
PROTEIN UA: NEGATIVE
SPEC GRAV UA: 1.02
Urobilinogen, UA: 0.2

## 2015-08-06 MED ORDER — CIPROFLOXACIN HCL 250 MG PO TABS: 250.0000 mg | ORAL_TABLET | Freq: Two times a day (BID) | ORAL | Status: DC

## 2015-08-06 NOTE — Progress Notes (Signed)
Patient ID: Mckenzie Wiley, female   DOB: [birth year], 81 y.o.   MRN: [id number]    Subjective:  HPI  Patient saw her urologist for regular check up 4 weeks ago and had a good exam. About 2 weeks now she has had issue with burning with urination, lower back pain, vaginal pain/discomfort to sit down, stinging sensation after urination. No hematuria. No fever.  Prior to Admission medications   Medication Sig Start Date End Date Taking? Authorizing Provider  acetaminophen (TYLENOL) 500 MG tablet Take 650 mg by mouth.   Yes Historical Provider, MD  albuterol (PROVENTIL HFA) 108 (90 Base) MCG/ACT inhaler Inhale into the lungs. 09/04/14  Yes Historical Provider, MD  ALPRAZolam (XANAX) 0.5 MG tablet TAKE 1 TABLET BY MOUTH 3 TIMES DAILY AS NEEDED. **SANDOZ ONLY** 07/19/15  Yes Lorie PhenixNancy Mardelle Pandolfi, MD  aspirin 325 MG tablet Take by mouth.   Yes Historical Provider, MD  Biotin (SUPER BIOTIN) 5 MG TABS Take by mouth.   Yes Historical Provider, MD  Calcium Carbonate-Vitamin D 600-200 MG-UNIT CAPS Take by mouth.   Yes Historical Provider, MD  clonazePAM (KLONOPIN) 0.5 MG tablet TAKE 1 TABLET BY MOUTH TWICE DAILY AS NEEDED FOR ANXIETY 05/18/15  Yes Lorie PhenixNancy Nial Hawe, MD  Coenzyme Q10 (CO Q 10) 100 MG CAPS Take by mouth.   Yes Historical Provider, MD  cyanocobalamin 100 MCG tablet Take by mouth.   Yes Historical Provider, MD  doxepin (SINEQUAN) 50 MG capsule Take by mouth.   Yes Historical Provider, MD  escitalopram (LEXAPRO) 10 MG tablet TAKE 2 TABLETS BY MOUTH ONCE DAILY 06/19/15  Yes Lorie PhenixNancy Zyair Rhein, MD  esomeprazole (NEXIUM) 40 MG packet Take by mouth.   Yes Historical Provider, MD  fluticasone Aleda Grana(FLONASE) 50 MCG/ACT nasal spray  01/10/15  Yes Historical Provider, MD  HYDROcodone-acetaminophen (NORCO/VICODIN) 5-325 MG tablet  04/16/14  Yes Historical Provider, MD  hydroxychloroquine (PLAQUENIL) 200 MG tablet Take by mouth.   Yes Historical Provider, MD  latanoprost (XALATAN) 0.005 % ophthalmic solution Apply to eye.   Yes  Historical Provider, MD  levothyroxine (SYNTHROID, LEVOTHROID) 88 MCG tablet TAKE 1 TABLET BY MOUTH ONCE DAILY ON AN EMPTY STOMACH. WAIT 30 MINUTES BEFORE TAKING OTHER MEDS. 01/17/15  Yes Lorie PhenixNancy Miangel Flom, MD  nadolol (CORGARD) 20 MG tablet Take 20 mg by mouth.   Yes Historical Provider, MD  omeprazole (PRILOSEC) 20 MG capsule TAKE ONE CAPSULE BY MOUTH ONCE A DAY. 02/15/15  Yes Lorie PhenixNancy Kista Robb, MD  pravastatin (PRAVACHOL) 10 MG tablet  05/17/15  Yes Historical Provider, MD  pyridOXINE (VITAMIN B-6) 100 MG tablet Take by mouth.   Yes Historical Provider, MD  Vitamin D, Cholecalciferol, 1000 units CAPS Take by mouth.   Yes Historical Provider, MD    Patient Active Problem List   Diagnosis Date Noted  . History of artificial joint 06/26/2015  . Dermatophytosis of groin 06/26/2015  . Encounter for screening for osteoporosis 06/26/2015  . Adiposity 06/26/2015  . OP (osteoporosis) 06/26/2015  . Disorder of kidney 06/26/2015  . ILD (interstitial lung disease) (HCC) 06/26/2015  . Chronic interstitial cystitis 06/26/2015  . Fibrositis 06/26/2015  . Can't get food down 06/26/2015  . Stasis, venous 05/23/2015  . ASCUS favoring benign 05/23/2015  . Adult BMI 30+ 05/23/2015  . Clinical depression 05/23/2015  . Dermatitis, eczematoid 05/23/2015  . Glaucoma 05/23/2015  . Panaritium of finger 05/23/2015  . Gougerout-Sjoegren syndrome (HCC) 05/23/2015  . Awareness of heartbeats 02/09/2015  . Acid reflux 01/18/2015  . DDD (degenerative disc disease), cervical 01/17/2015  .  Hypercholesteremia 01/17/2015  . Adult hypothyroidism 01/17/2015  . Hypertension 11/16/2014  . Anxiety 10/24/2014  . Carotid artery narrowing 06/26/2014  . Breathlessness on exertion 06/26/2014  . Arterial disease (HCC) 11/10/2013  . Trochanteric bursitis of left hip 10/20/2013  . Thoracic neuritis 10/20/2013  . Chronic kidney disease (CKD), stage III (moderate) 07/08/2013  . Personal history of transient ischemic attack (TIA),  and cerebral infarction without residual deficits 07/08/2013  . Sicca syndrome (HCC) 07/08/2013  . Sjogren's syndrome (HCC) 07/08/2013  . Systemic lupus erythematosus (HCC) 07/08/2013  . Bladder infection, chronic 03/04/2012  . Incomplete bladder emptying 03/04/2012  . Disorder of sacrum 12/23/2011  . DDD (degenerative disc disease), lumbar 01/18/2011  . Undifferentiated connective tissue disease (HCC) 01/18/2011  . Diffuse connective tissue disease (HCC) 01/18/2011  . Disease of lung 01/18/2011  . Chronic restrictive lung disease 01/18/2011  . Arthritis, degenerative 01/18/2011  . Polymyositis (HCC) 01/18/2011    Past Medical History  Diagnosis Date  . Thyroid disease   . Anxiety   . Degenerative disc disease, cervical   . Chronic cystitis   . Venous stasis   . Sjogren's disease (HCC)   . GERD (gastroesophageal reflux disease)   . Systemic lupus erythematosus (HCC)   . Fibromyalgia   . Glaucoma   . Osteoarthritis of both knees   . Interstitial lung disease (HCC)   . Osteoporosis   . Hyperlipidemia   . Kidney disease   . TIA (transient ischemic attack)     Social History   Social History  . Marital Status: Unknown    Spouse Name: N/A  . Number of Children: N/A  . Years of Education: N/A   Occupational History  . Not on file.   Social History Main Topics  . Smoking status: Never Smoker   . Smokeless tobacco: Not on file  . Alcohol Use: No  . Drug Use: No  . Sexual Activity: Not on file   Other Topics Concern  . Not on file   Social History Narrative    Allergies  Allergen Reactions  . Cortisone     Other reaction(s): Other (See Comments) Other Reaction: OCULAR PRESSURE INCREASES  . Loteprednol Etabonate     Other reaction(s): Other (See Comments) Burned eyes and eye lids Other reaction(s): EYE IRRITATION Other reaction(s): Other (See Comments) Burned eyes and eye lids  . Sulfur Swelling    Also nausea  . Fluticasone     Other reaction(s): Other  (See Comments) Other Reaction: raised pressure of eyes  . Lovastatin     Other reaction(s): Muscle Pain Aches   . Montelukast     Other reaction(s): Other (See Comments) Other Reaction: TACHYCARDIA  . Olopatadine     Other reaction(s): Other (See Comments) Other Reaction: VERY DRY EYES  . Prednisone     Other reaction(s): Other (See Comments) Other Reaction: INCREASED OCULAR PRESSURE  . Shellfish Allergy     swelling and nausea  . Simvastatin     Other reaction(s): Muscle Pain aches  . Sulfa Antibiotics     swollen face  . Codeine Nausea Only    Review of Systems  Constitutional: Negative.   Respiratory: Negative.   Cardiovascular: Negative.   Genitourinary: Positive for dysuria and flank pain.  Musculoskeletal: Positive for joint pain and neck pain.  Psychiatric/Behavioral: The patient is nervous/anxious.     Immunization History  Administered Date(s) Administered  . Influenza, High Dose Seasonal PF 02/27/2015  . Tdap 09/26/2010   Objective:  BP 120/62  mmHg  Pulse 80  Temp(Src) 98.3 F (36.8 C)  Resp 16  Wt 192 lb (87.091 kg)  Physical Exam  Constitutional: She is oriented to person, place, and time and well-developed, well-nourished, and in no distress.  Genitourinary:  Tender over mons above and at right labia. Some tenderness on left also. Is fuller on the right, asymmetric, with no focal mass or lesion appreciated.  No cystocele noted.    Neurological: She is alert and oriented to person, place, and time.  Psychiatric: Mood, memory, affect and judgment normal.    Lab Results  Component Value Date   WBC 5.9 05/19/2013   HGB 12.8 05/19/2013   HCT 39.2 05/19/2013   PLT 149* 05/19/2013   GLUCOSE 85 05/20/2013   CHOL 194 07/18/2013   TRIG 99 07/18/2013   HDL 69 07/18/2013   LDLCALC 105 07/18/2013   TSH 0.760 05/20/2013   HGBA1C 5.5 05/20/2013    CMP     Component Value Date/Time   NA 141 05/20/2013 0059   K 3.5 05/20/2013 0059   CL 110*  05/20/2013 0059   CO2 25 05/20/2013 0059   GLUCOSE 85 05/20/2013 0059   BUN 30* 05/20/2013 0059   CREATININE 0.86 05/20/2013 0059   CALCIUM 8.6 05/20/2013 0059   PROT 6.7 05/19/2013 1721   ALBUMIN 3.4 05/19/2013 1721   AST 19 05/19/2013 1721   ALT 25 05/19/2013 1721   ALKPHOS 109 05/19/2013 1721   BILITOT 0.3 05/19/2013 1721   GFRNONAA >60 05/20/2013 0059   GFRAA >60 05/20/2013 0059    Assessment and Plan :  1. Acute cystitis with hematuria UA is slightly abnormal today, will send for culture pending results. Will treat with Cipro as patient feel symptoms are most consistent with UTI.   - POCT urinalysis dipstick - Urine Culture  2. Soft tissue bruising New problem. On the exam swelling is present. No mass, no lesions, no bruising present on the exam. This could be possibly hernia vs muscle bruising vs vein issue, may need to proceed with [place] or refer to a specialist. Due to her health issues can not treat with NSAIDs. Patient does not want to to be referred at this time. Will continue tylenol and call if worsens or does not improve.   3. Chronic kidney disease (CKD), stage III (moderate) Stable.    4. Chronic interstitial cystitis Follows urologist.  5. Bladder infection, chronic Will treat as above. Patient instructed to call back if condition worsens or does not improve.   Culture pending.    Patient was seen and examined by Dr. Lorie Phenix and note was scribed by Samara Deist, RMA.  I have reviewed the document for accuracy and completeness and I agree with above. - Leo Grosser, MD   [facility] 08/06/2015 2:04 PM

## 2015-08-07 ENCOUNTER — Telehealth: Payer: Self-pay | Admitting: Family Medicine

## 2015-08-07 MED ORDER — MOMETASONE FUROATE 0.1 % EX OINT: TOPICAL_OINTMENT | Freq: Every day | CUTANEOUS | Status: DC

## 2015-08-07 NOTE — Telephone Encounter (Signed)
Pt called saying that she was in yesterday.  She came in for red and swollen vaginal area.  She used the mediation cream that she puts on her lupus rash on that rash and it help a lot.  She wants to know if you will call a refill in for her.  The name is mometasone furoate .0.1 eloncon.  [place]   Would like a large amount. [name].  ThanksTeri

## 2015-08-07 NOTE — Telephone Encounter (Signed)
Please review-aa 

## 2015-08-07 NOTE — Telephone Encounter (Signed)
Ok to call in rx.  Thanks.  

## 2015-08-07 NOTE — Telephone Encounter (Signed)
rx sent in-aa 

## 2015-08-08 LAB — URINE CULTURE

## 2015-08-09 ENCOUNTER — Telehealth: Payer: Self-pay

## 2015-08-09 ENCOUNTER — Other Ambulatory Visit: Payer: Self-pay

## 2015-08-09 MED ORDER — CIPROFLOXACIN HCL 500 MG PO TABS: 500.0000 mg | ORAL_TABLET | Freq: Two times a day (BID) | ORAL | Status: DC

## 2015-08-09 NOTE — Telephone Encounter (Signed)
Please send in new rx for Cipro 500 bid for 7 days. Leave patient message,  Do not like to go that high secondary to side effects, but will increase secondary to still not well.  Thanks.

## 2015-08-09 NOTE — Telephone Encounter (Signed)
-----   Message from Lorie PhenixNancy Maloney, MD sent at 08/08/2015  4:54 PM EDT ----- Does have UTI. Treated with Cipro. Please see how patient is doing. Thanks.

## 2015-08-09 NOTE — Telephone Encounter (Signed)
Patient states she has been on the Cipro 250 since Monday night and is not feeling any better.  She wanted to let you know she usually takes Cipro 500 mg.  She also said that when calling her back if she does not answer, it is ok to leave instructions on her voicemail.  Her husband is in the hospital currently and she may be hard to contact. Thank you Mckenzie Wiley

## 2015-09-13 ENCOUNTER — Other Ambulatory Visit: Payer: Self-pay | Admitting: Family Medicine

## 2015-09-17 ENCOUNTER — Other Ambulatory Visit: Payer: Self-pay | Admitting: Family Medicine

## 2015-09-17 DIAGNOSIS — K219 Gastro-esophageal reflux disease without esophagitis: Secondary | ICD-10-CM

## 2015-10-24 ENCOUNTER — Other Ambulatory Visit: Payer: Self-pay | Admitting: Physical Medicine and Rehabilitation

## 2015-10-24 DIAGNOSIS — M5412 Radiculopathy, cervical region: Secondary | ICD-10-CM

## 2015-11-10 ENCOUNTER — Ambulatory Visit: Payer: Medicare Other

## 2015-11-13 IMAGING — CR DG CHEST 2V
1 series · 2 of 2 positions shown · non-contrast
Comparison: Chest x-ray of 07/15/2012

CLINICAL DATA: Cough for 3 weeks, history of interstitial lung
disease

EXAM:
CHEST  2 VIEW

[Series 1: dg chest 2 view · 0.14mm/px · 2 of 2 slices shown]
[im 1/2]
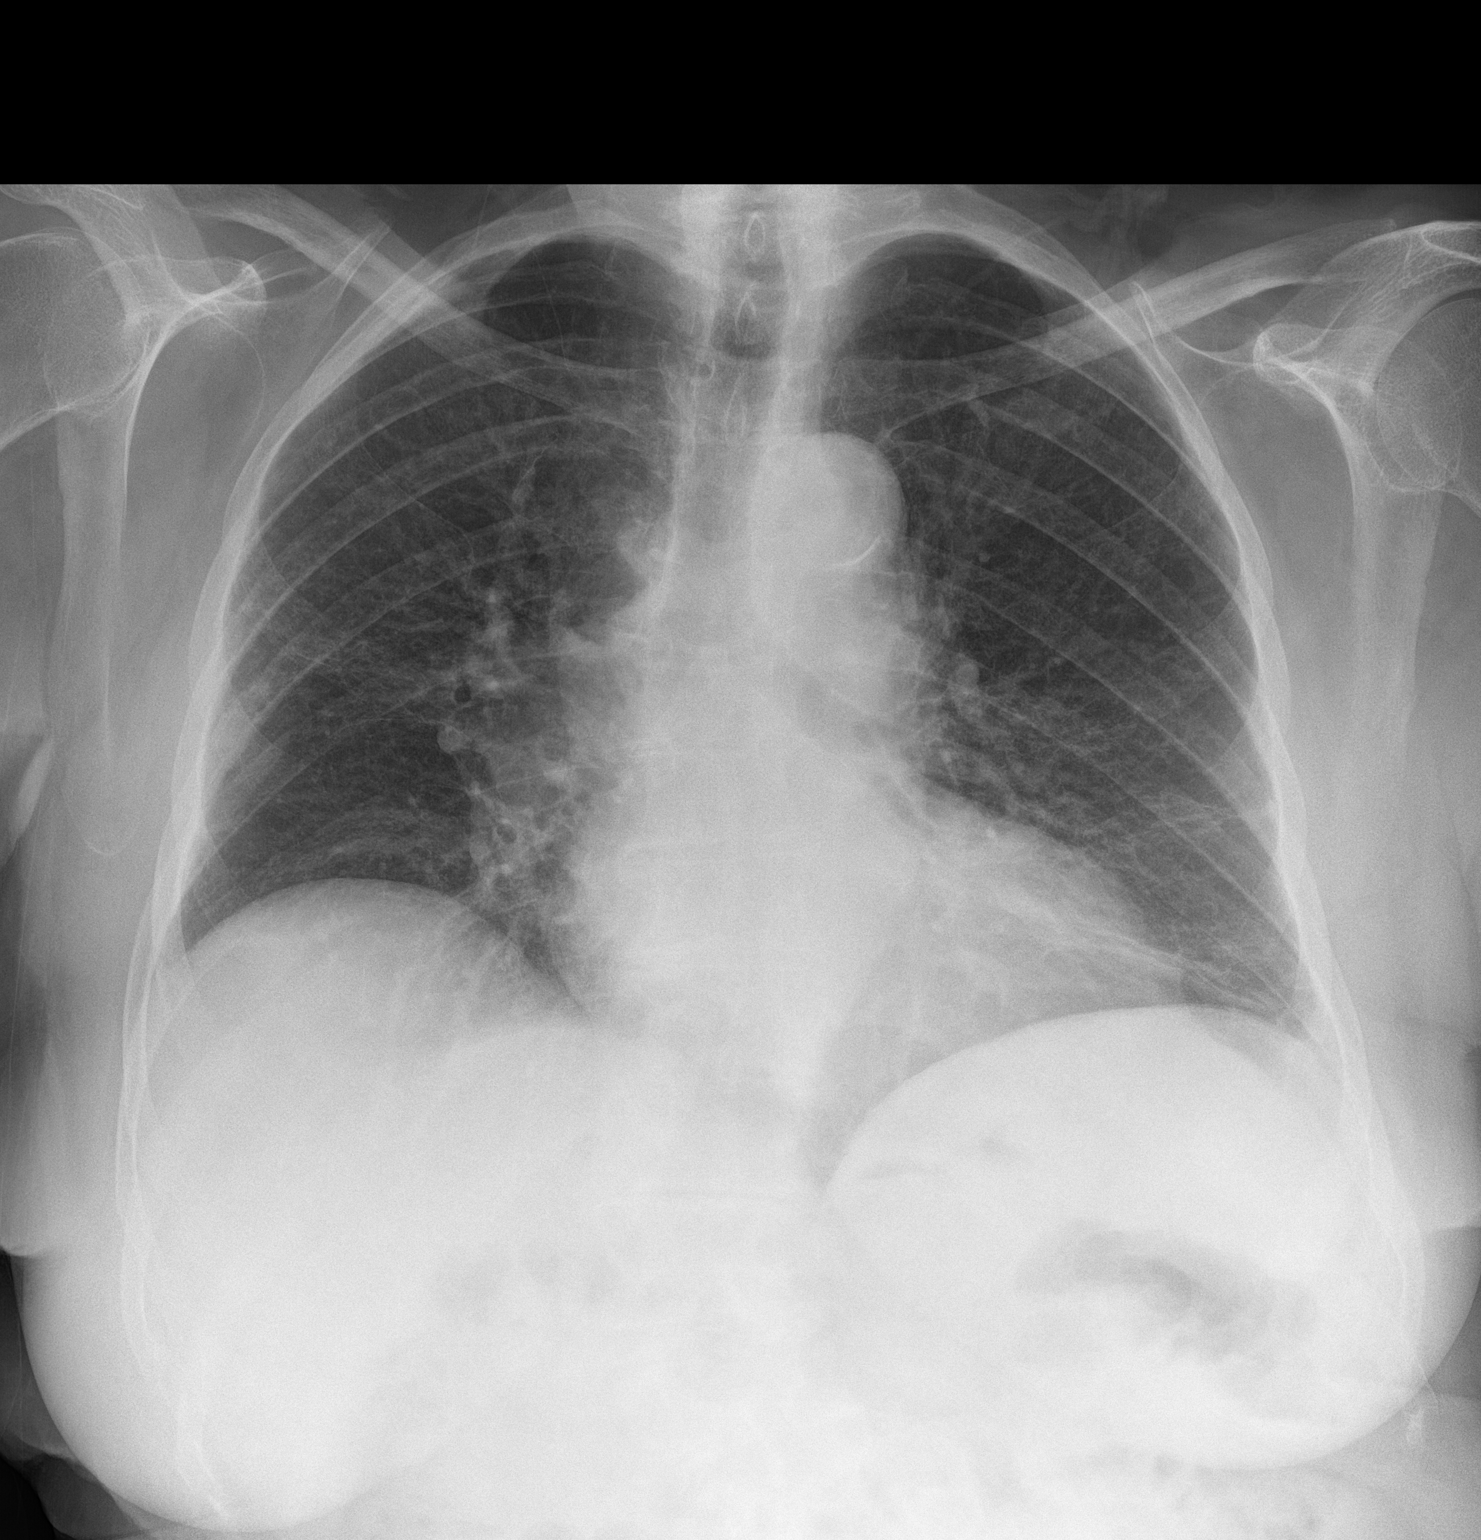
[im 2/2]
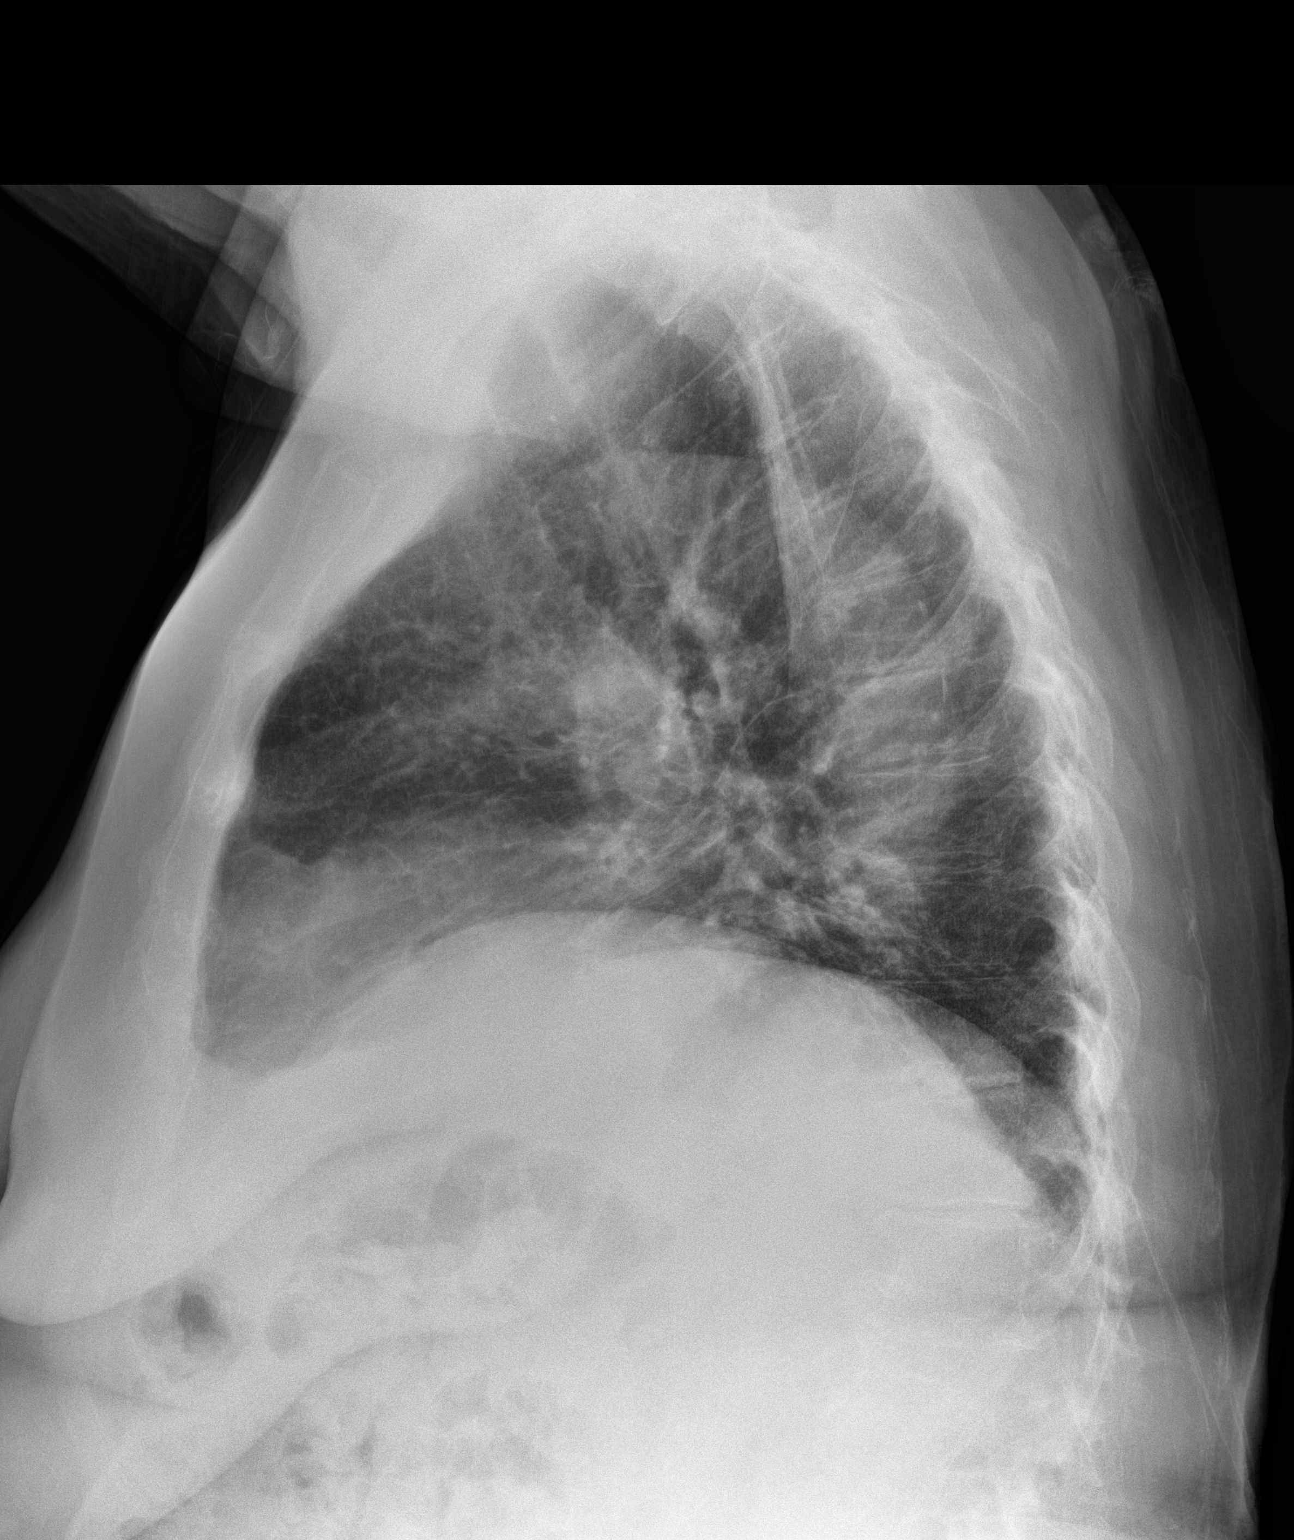

[2 of 2 positions shown; findings below may reference images not displayed]

FINDINGS: There are prominent linear markings at the lung bases left greater
than right most consistent with scarring. Also there is evidence of
peribronchial thickening which may indicate chronic bronchitis. No
focal infiltrate or effusion is seen. Mediastinal and hilar contours
are unremarkable and and mild cardiomegaly is stable. The bones are
osteopenic.
IMPRESSION: Stable chronic change. Probable chronic bronchitis. No definite
active process.

## 2015-11-17 ENCOUNTER — Ambulatory Visit: Payer: Medicare Other

## 2015-11-30 NOTE — Telephone Encounter (Signed)
error 

## 2015-12-07 ENCOUNTER — Ambulatory Visit
Admission: RE | Admit: 2015-12-07 | Discharge: 2015-12-07 | Disposition: A | Discharge status: Home / Self Care | Payer: Medicare Other | Source: Ambulatory Visit | Attending: Physical Medicine and Rehabilitation | Admitting: Physical Medicine and Rehabilitation

## 2015-12-07 DIAGNOSIS — M5412 Radiculopathy, cervical region: Secondary | ICD-10-CM | POA: Diagnosis present

## 2015-12-07 DIAGNOSIS — M50121 Cervical disc disorder at C4-C5 level with radiculopathy: Secondary | ICD-10-CM | POA: Insufficient documentation

## 2015-12-07 DIAGNOSIS — M4802 Spinal stenosis, cervical region: Secondary | ICD-10-CM | POA: Diagnosis not present

## 2015-12-14 ENCOUNTER — Other Ambulatory Visit: Payer: Self-pay | Admitting: Family Medicine

## 2015-12-14 DIAGNOSIS — E039 Hypothyroidism, unspecified: Secondary | ICD-10-CM

## 2015-12-14 DIAGNOSIS — K219 Gastro-esophageal reflux disease without esophagitis: Secondary | ICD-10-CM

## 2015-12-14 NOTE — Telephone Encounter (Signed)
Is a Dr. Elease HashimotoMaloney pt. Does not have FU scheduled with new provider. Allene DillonEmily Drozdowski, CMA

## 2016-02-07 ENCOUNTER — Telehealth: Payer: Self-pay

## 2016-02-07 ENCOUNTER — Ambulatory Visit
Admission: EM | Admit: 2016-02-07 | Discharge: 2016-02-07 | Disposition: A | Discharge status: Home / Self Care | Payer: Medicare Other | Attending: Emergency Medicine | Admitting: Emergency Medicine

## 2016-02-07 ENCOUNTER — Encounter: Payer: Self-pay | Admitting: *Deleted

## 2016-02-07 DIAGNOSIS — L304 Erythema intertrigo: Secondary | ICD-10-CM

## 2016-02-07 DIAGNOSIS — R490 Dysphonia: Secondary | ICD-10-CM | POA: Diagnosis not present

## 2016-02-07 DIAGNOSIS — N3000 Acute cystitis without hematuria: Secondary | ICD-10-CM | POA: Diagnosis not present

## 2016-02-07 LAB — URINALYSIS COMPLETE WITH MICROSCOPIC (ARMC ONLY)
Bilirubin Urine: NEGATIVE
GLUCOSE, UA: NEGATIVE mg/dL
HGB URINE DIPSTICK: NEGATIVE
Ketones, ur: NEGATIVE mg/dL
Nitrite: NEGATIVE
Specific Gravity, Urine: 1.025 (ref 1.005–1.030)
pH: 5 (ref 5.0–8.0)

## 2016-02-07 MED ORDER — FLUCONAZOLE 150 MG PO TABS: 150.0000 mg | ORAL_TABLET | Freq: Once | ORAL | 1 refills | Status: AC

## 2016-02-07 MED ORDER — PHENAZOPYRIDINE HCL 200 MG PO TABS: 200.0000 mg | ORAL_TABLET | Freq: Three times a day (TID) | ORAL | 0 refills | Status: DC | PRN

## 2016-02-07 MED ORDER — CEPHALEXIN 500 MG PO CAPS: 500.0000 mg | ORAL_CAPSULE | Freq: Two times a day (BID) | ORAL | 0 refills | Status: DC

## 2016-02-07 MED ORDER — MUPIROCIN 2 % EX OINT: 1.0000 "application " | TOPICAL_OINTMENT | Freq: Three times a day (TID) | CUTANEOUS | 0 refills | Status: DC

## 2016-02-07 MED ORDER — NYSTATIN 100000 UNIT/GM EX CREA: TOPICAL_CREAM | CUTANEOUS | 0 refills | Status: DC

## 2016-02-07 NOTE — ED Provider Notes (Signed)
HPI  SUBJECTIVE:  Mckenzie Wiley is a 81 y.o. female who presents with multiple complaints. First, she reports an erythematous, burning red rash in her right lower abdomen along the pannus. States this started last night. She states it does not itch. No new lotions, soaps, detergents or change in medications. No preceding paresthesias. No fevers. No rash elsewhere. She tried an unknown antibiotic cream and a pad without improvement. Symptoms are worse palpation, no alleviating factors. No rash like this elsewhere.  Second, she reports dysuria, low abdominal pain and bilateral low back pain for the past several weeks. There are no aggravating or alleviating factors. She has not tried anything for this. She denies fevers, nausea, vomiting, bodyaches, vaginal discharge, genital itching. She states that this is identical to previous UTIs which she states she gets frequently. She has been placed on Cipro for this in the past.   Third, she reports 3-4 months of raspy voice with an occasional sore throat. She reports some nasal congestion and postnasal drip, she has Flonase at home but does not use this on a regular basis. Symptoms are better with rest and worse with talking particular at the end of the day. She has tried cough drops, over-the-counter cold medicines and Flonase. No sensation of throat swelling shut, difficulty breathing, drooling or trismus. She has a past medical history including lupus, chronic lung disease secondary to lupus, UTIs. She has not been on any recent abx for a UTI. PMD: At [facility] family [facility].   . Diagnosis Date  . Anxiety   . Chronic cystitis   . Degenerative disc disease, cervical   . Fibromyalgia   . GERD (gastroesophageal reflux disease)   . Glaucoma   . Hyperlipidemia   . Interstitial lung disease (HCC)   . Kidney disease   . Osteoarthritis of both knees   . Osteoporosis   . Sjogren's disease (HCC)   . Systemic lupus erythematosus (HCC)   . Thyroid  disease   . TIA (transient ischemic attack)   . Venous stasis     Past Surgical History:  Procedure Laterality Date  . ABDOMINAL HYSTERECTOMY  1984   Total;Menorrhagia. B oophorectomy  . APPENDECTOMY  1982  . CYSTOSCOPY  2011   Cystitis Follicularis  . LEEP  2003   at [facility]  . LUMBAR DISC SURGERY  1994  . TOTAL KNEE ARTHROPLASTY Bilateral 2006    History reviewed. No pertinent family history.  Social History  Substance Use Topics  . Smoking status: Never Smoker  . Smokeless tobacco: Never Used  . Alcohol use No    No current facility-administered medications for this encounter.   Current Outpatient Prescriptions:  .  acetaminophen (TYLENOL) 500 MG tablet, Take 650 mg by mouth., Disp: , Rfl:  .  aspirin 325 MG tablet, Take by mouth., Disp: , Rfl:  .  clonazePAM (KLONOPIN) 0.5 MG tablet, TAKE 1 TABLET BY MOUTH TWICE DAILY AS NEEDED FOR ANXIETY, Disp: 60 tablet, Rfl: 5 .  doxepin (SINEQUAN) 50 MG capsule, Take by mouth., Disp: , Rfl:  .  esomeprazole (NEXIUM) 40 MG packet, Take by mouth., Disp: , Rfl:  .  HYDROcodone-acetaminophen (NORCO/VICODIN) 5-325 MG tablet, , Disp: , Rfl:  .  hydroxychloroquine (PLAQUENIL) 200 MG tablet, Take by mouth., Disp: , Rfl:  .  latanoprost (XALATAN) 0.005 % ophthalmic solution, Apply to eye., Disp: , Rfl:  .  levothyroxine (SYNTHROID, LEVOTHROID) 88 MCG tablet, Take 1 tablet (88 mcg total) by mouth daily before breakfast. PATIENT  NEEDS TO SCHEDULE OFFICE VISIT FOR FOLLOW UP, Disp: 90 tablet, Rfl: 0 .  nadolol (CORGARD) 20 MG tablet, Take 20 mg by mouth., Disp: , Rfl:  .  albuterol (PROVENTIL HFA) 108 (90 Base) MCG/ACT inhaler, Inhale into the lungs., Disp: , Rfl:  .  ALPRAZolam (XANAX) 0.5 MG tablet, TAKE 1 TABLET BY MOUTH 3 TIMES DAILY AS NEEDED. **SANDOZ ONLY**, Disp: 90 tablet, Rfl: 1 .  Biotin (SUPER BIOTIN) 5 MG TABS, Take by mouth., Disp: , Rfl:  .  Calcium Carbonate-Vitamin D 600-200 MG-UNIT CAPS, Take by mouth., Disp: , Rfl:  .   cephALEXin (KEFLEX) 500 MG capsule, Take 1 capsule (500 mg total) by mouth 2 (two) times daily., Disp: 14 capsule, Rfl: 0 .  Coenzyme Q10 (CO Q 10) 100 MG CAPS, Take by mouth., Disp: , Rfl:  .  cyanocobalamin 100 MCG tablet, Take by mouth., Disp: , Rfl:  .  escitalopram (LEXAPRO) 10 MG tablet, TAKE 2 TABLETS BY MOUTH ONCE DAILY, Disp: 60 tablet, Rfl: 5 .  fluconazole (DIFLUCAN) 150 MG tablet, Take 1 tablet (150 mg total) by mouth once. 1 tab po x 1. May repeat in 72 hours if no improvement, Disp: 2 tablet, Rfl: 1 .  fluticasone (FLONASE) 50 MCG/ACT nasal spray, , Disp: , Rfl:  .  mupirocin ointment (BACTROBAN) 2 %, Apply 1 application topically 3 (three) times daily., Disp: 22 g, Rfl: 0 .  nystatin cream (MYCOSTATIN), Apply to affected area 2 times daily till symptoms resolve, Disp: 30 g, Rfl: 0 .  omeprazole (PRILOSEC) 20 MG capsule, TAKE ONE CAPSULE BY MOUTH ONCE A DAY., Disp: 90 capsule, Rfl: 0 .  phenazopyridine (PYRIDIUM) 200 MG tablet, Take 1 tablet (200 mg total) by mouth 3 (three) times daily as needed for pain., Disp: 6 tablet, Rfl: 0 .  pravastatin (PRAVACHOL) 10 MG tablet, , Disp: , Rfl:  .  pyridOXINE (VITAMIN B-6) 100 MG tablet, Take by mouth., Disp: , Rfl:  .  Vitamin D, Cholecalciferol, 1000 units CAPS, Take by mouth., Disp: , Rfl:   Allergies  Allergen Reactions  . Cortisone     Other reaction(s): Other (See Comments) Other Reaction: OCULAR PRESSURE INCREASES  . Loteprednol Etabonate     Other reaction(s): Other (See Comments) Burned eyes and eye lids Other reaction(s): EYE IRRITATION Other reaction(s): Other (See Comments) Burned eyes and eye lids  . Sulfur Swelling    Also nausea  . Fluticasone     Other reaction(s): Other (See Comments) Other Reaction: raised pressure of eyes  . Lovastatin     Other reaction(s): Muscle Pain Aches   . Montelukast     Other reaction(s): Other (See Comments) Other Reaction: TACHYCARDIA  . Olopatadine     Other reaction(s): Other  (See Comments) Other Reaction: VERY DRY EYES  . Prednisone     Other reaction(s): Other (See Comments) Other Reaction: INCREASED OCULAR PRESSURE  . Shellfish Allergy     swelling and nausea  . Simvastatin     Other reaction(s): Muscle Pain aches  . Sulfa Antibiotics     swollen face  . Codeine Nausea Only     ROS  As noted in HPI.   Physical Exam  BP (!) 105/51 (BP Location: Left Arm)   Pulse 81   Temp 98.7 F (37.1 C) (Oral)   Resp 16   Ht 5\' 4"  (1.626 m)   Wt 170 lb (77.1 kg)   SpO2 96%   BMI 29.18 kg/m   Constitutional: Well developed,  well nourished, no acute distress Eyes:  EOMI, conjunctiva normal bilaterally HENT: Normocephalic, atraumatic,mucus membranes moistYear TMs normal bilaterally. Mild nasal congestion. Positive cobblestoning. Normal oropharynx.  Neck: No cervical lymphadenopathy  Respiratory: Normal inspiratory effort Cardiovascular: Normal rate GI: nondistended. Positive suprapubic tenderness. No flank tenderness. active bowel sounds.  Back: No CVA tenderness skin: 11.5 x 11 cm tender area of macerated skin. No crusting. Symmetric rash. This is located in her right lower pannus see picture    Musculoskeletal: no deformities Neurologic: Alert & oriented x 3, no focal neuro deficits Psychiatric: Speech and behavior appropriate   ED Course   Medications - No data to display  Orders Placed This Encounter  Procedures  . Urine culture    Standing Status:   Standing    Number of Occurrences:   1    Order Specific Question:   Patient immune status    Answer:   Immunocompromised  . Urinalysis complete, with microscopic    Standing Status:   Standing    Number of Occurrences:   1    Results for orders placed or performed during the hospital encounter of 02/07/16 (from the past 24 hour(s))  Urinalysis complete, with microscopic     Status: Abnormal   Collection Time: 02/07/16  7:24 PM  Result Value Ref Range   Color, Urine YELLOW YELLOW    APPearance CLEAR CLEAR   Glucose, UA NEGATIVE NEGATIVE mg/dL   Bilirubin Urine NEGATIVE NEGATIVE   Ketones, ur NEGATIVE NEGATIVE mg/dL   Specific Gravity, Urine 1.025 1.005 - 1.030   Hgb urine dipstick NEGATIVE NEGATIVE   pH 5.0 5.0 - 8.0   Protein, ur TRACE (A) NEGATIVE mg/dL   Nitrite NEGATIVE NEGATIVE   Leukocytes, UA TRACE (A) NEGATIVE   RBC / HPF 0-5 0 - 5 RBC/hpf   WBC, UA 6-30 0 - 5 WBC/hpf   Bacteria, UA FEW (A) NONE SEEN   Squamous Epithelial / LPF 0-5 (A) NONE SEEN   Mucous PRESENT    Hyaline Casts, UA PRESENT    Granular Casts, UA PRESENT    No results found.  ED Clinical Impression  Intertrigo  Acute cystitis without hematuria  Voice hoarseness   ED Assessment/Plan  Intertrigo. We'll send home with nystatin, Bactroban  out prevent secondary bacterial infection,and Diflucan 150 mg times one repeat in 72 hours.  Dysuria. UA suggestive of UTI with trace leukocytes and a few bacteria, however is not definitive for this. However given the dysuria and suprapubic tenderness we'll treat empirically as if this is a UTI. We'll send home with Keflex 500 mg by mouth twice a day for 7 days which will also treat any skin infection, sending urine for culture to confirm antibiotic choice. She will need to follow-up with her primary care physician for reevaluation and possible extension of her antibiotic therapy if her symptoms persist. We'll also send home with Pyridium.  Voice hoarseness: No evidence of airway compromise. May be a nodule, may be a laryngitis from talking. Patient is on inhaled steroids for her lung disease, these may be irritating her vocal cords. We'll have her follow-up with ENT, Dr. Elenore Rota if getting worse.  Discussed labs, imaging, MDM, plan and followup with patient. Discussed sn/sx that should prompt return to the ED. Patient  agrees with plan.   Meds ordered this encounter  Medications  . mupirocin ointment (BACTROBAN) 2 %    Sig: Apply 1 application  topically 3 (three) times daily.    Dispense:  22 g  Refill:  0  . nystatin cream (MYCOSTATIN)    Sig: Apply to affected area 2 times daily till symptoms resolve    Dispense:  30 g    Refill:  0  . cephALEXin (KEFLEX) 500 MG capsule    Sig: Take 1 capsule (500 mg total) by mouth 2 (two) times daily.    Dispense:  14 capsule    Refill:  0  . phenazopyridine (PYRIDIUM) 200 MG tablet    Sig: Take 1 tablet (200 mg total) by mouth 3 (three) times daily as needed for pain.    Dispense:  6 tablet    Refill:  0  . fluconazole (DIFLUCAN) 150 MG tablet    Sig: Take 1 tablet (150 mg total) by mouth once. 1 tab po x 1. May repeat in 72 hours if no improvement    Dispense:  2 tablet    Refill:  1    *This clinic note was created using Scientist, clinical (histocompatibility and immunogenetics)Dragon dictation software. Therefore, there may be occasional mistakes despite careful proofreading.  ?   Domenick GongAshley Lucion Dilger, MD 02/07/16 2112

## 2016-02-07 NOTE — Telephone Encounter (Signed)
Mckenzie Wiley, a PT with [facility], called because pt was c/o rash on LLQ. Is red and burning. Is 8/10 on a pain scale. Started yesterday. Mckenzie AddisonKatie is concerned this is shingles, as pt has been in a lot of stress lately. Advised pt she could wait for an appointment tomorrow, or go to an urgent care tonight to start treatment sooner. Pt opted to go to [facility].Marland Kitchen. Allene DillonEmily Drozdowski, CMA

## 2016-02-07 NOTE — ED Triage Notes (Signed)
Pt has rash to right side. Hx of Lupus, pt states this rash is not like rashes she has had with Lupus before.  Also, pt has hoareness, morning sore throat and headache. Concerned with her left facial sinus as her ENT Dr recommended surgery for but she had to decline due her and her husband's current health.

## 2016-02-09 LAB — URINE CULTURE

## 2016-02-11 ENCOUNTER — Telehealth: Payer: Self-pay | Admitting: Emergency Medicine

## 2016-02-11 NOTE — Telephone Encounter (Signed)
Message left for patient to call back for her urine culture result and if her symptoms are not improving or worsening while taking the antibiotic.

## 2016-02-12 ENCOUNTER — Other Ambulatory Visit: Payer: Self-pay | Admitting: Family Medicine

## 2016-02-12 ENCOUNTER — Telehealth: Payer: Self-pay

## 2016-02-12 DIAGNOSIS — E039 Hypothyroidism, unspecified: Secondary | ICD-10-CM

## 2016-02-12 NOTE — Telephone Encounter (Signed)
Spoke to patient on the phone about her symptoms. She stated that her urinary symptoms have cleared up however the cream prescribed for her rash isnt helping. I advised her to follow up with her PCP or [facility]

## 2016-03-13 ENCOUNTER — Other Ambulatory Visit: Payer: Self-pay | Admitting: Family Medicine

## 2016-03-13 DIAGNOSIS — K219 Gastro-esophageal reflux disease without esophagitis: Secondary | ICD-10-CM

## 2016-05-28 ENCOUNTER — Telehealth: Payer: Self-pay | Admitting: Family Medicine

## 2016-05-28 NOTE — Telephone Encounter (Signed)
Called Pt to schedule AWV with [facility] - knb °

## 2017-01-05 ENCOUNTER — Other Ambulatory Visit: Payer: Self-pay | Admitting: Family Medicine

## 2017-01-05 DIAGNOSIS — E039 Hypothyroidism, unspecified: Secondary | ICD-10-CM

## 2017-04-08 ENCOUNTER — Other Ambulatory Visit: Payer: Self-pay | Admitting: Family Medicine

## 2017-04-08 DIAGNOSIS — K219 Gastro-esophageal reflux disease without esophagitis: Secondary | ICD-10-CM

## 2017-04-09 NOTE — Telephone Encounter (Signed)
Patient has not been seen since Dr. Elease HashimotoMaloney. She needs to make an appointment to establish with Dr. B before refill can be approved.

## 2017-04-14 NOTE — Telephone Encounter (Signed)
Patient will call back and schedule appt

## 2017-04-14 NOTE — Telephone Encounter (Signed)
Left message with daughter to schedule appt with Dr. B before she can have omeprazole refilled.

## 2017-05-25 ENCOUNTER — Ambulatory Visit: Payer: Medicare Other | Admitting: Pulmonary Disease

## 2017-06-09 ENCOUNTER — Other Ambulatory Visit: Payer: Self-pay | Admitting: Family Medicine

## 2017-06-09 DIAGNOSIS — K219 Gastro-esophageal reflux disease without esophagitis: Secondary | ICD-10-CM

## 2017-06-09 DIAGNOSIS — E039 Hypothyroidism, unspecified: Secondary | ICD-10-CM

## 2017-06-10 ENCOUNTER — Other Ambulatory Visit: Payer: Self-pay | Admitting: Family Medicine

## 2017-06-10 DIAGNOSIS — K219 Gastro-esophageal reflux disease without esophagitis: Secondary | ICD-10-CM

## 2017-06-10 DIAGNOSIS — E039 Hypothyroidism, unspecified: Secondary | ICD-10-CM

## 2017-10-29 ENCOUNTER — Ambulatory Visit
Admission: EM | Admit: 2017-10-29 | Discharge: 2017-10-29 | Disposition: A | Discharge status: Home / Self Care | Payer: Medicare Other | Attending: Family Medicine | Admitting: Family Medicine

## 2017-10-29 ENCOUNTER — Other Ambulatory Visit: Payer: Self-pay

## 2017-10-29 ENCOUNTER — Encounter: Payer: Self-pay | Admitting: Emergency Medicine

## 2017-10-29 DIAGNOSIS — R52 Pain, unspecified: Secondary | ICD-10-CM | POA: Diagnosis not present

## 2017-10-29 DIAGNOSIS — J209 Acute bronchitis, unspecified: Secondary | ICD-10-CM | POA: Diagnosis not present

## 2017-10-29 HISTORY — DX: Torticollis: M43.6

## 2017-10-29 MED ORDER — FLUCONAZOLE 150 MG PO TABS: 150.0000 mg | ORAL_TABLET | Freq: Once | ORAL | 0 refills | Status: AC

## 2017-10-29 MED ORDER — AZITHROMYCIN 250 MG PO TABS: 250.0000 mg | ORAL_TABLET | Freq: Every day | ORAL | 0 refills | Status: AC

## 2017-10-29 MED ORDER — GUAIFENESIN-DM 100-10 MG/5ML PO SYRP: 5.0000 mL | ORAL_SOLUTION | Freq: Four times a day (QID) | ORAL | 0 refills | Status: DC | PRN

## 2017-10-29 MED ORDER — GUAIFENESIN-DM 100-10 MG/5ML PO SYRP
5.0000 mL | ORAL_SOLUTION | Freq: Four times a day (QID) | ORAL | 0 refills | Status: AC | PRN
Start: 2017-10-29 — End: ?

## 2017-10-29 MED ORDER — AZITHROMYCIN 250 MG PO TABS: 250.0000 mg | ORAL_TABLET | Freq: Every day | ORAL | 0 refills | Status: DC

## 2017-10-29 MED ORDER — CETIRIZINE HCL 1 MG/ML PO SOLN: 5.0000 mg | Freq: Every day | ORAL | 0 refills | Status: AC

## 2017-10-29 NOTE — ED Triage Notes (Signed)
Patient in today c/o body aches, sore throat, cough and congestion x 5 days. Patient denies fever. Patient has not tried any OTC medication.

## 2017-10-29 NOTE — ED Provider Notes (Signed)
[facility]    CSN: [id number] Arrival date & time: 10/29/17  1606     History   Chief Complaint Chief Complaint  Patient presents with  . Generalized Body Aches    HPI Mckenzie Wiley is a 83 y.o. female history of previous TIA, lupus, Sjogren's, osteoporosis, osteoarthritis, interstitial lung disease, glaucoma, hyperlipidemia presenting today for evaluation of generalized body aches and arthralgias as well as URI symptoms.  URI symptoms include sore throat, cough and congestion.  Symptoms have been going on for approximately 5 days.  Denies any fever.  She has not been taking any medicines for this.  She also notes her body aches began right before her URI symptoms started, she is taking a tramadol occasionally which has helped, but she is concerned about getting addicted to these.  She denies chest pain, endorsing slight shortness of breath this morning when she woke up and her oxygen was reading 85%, but states that this resolved quickly and she has not felt short of breath since.  She does feel like her breathing is slightly different than normal.  HPI  Past Medical History:  Diagnosis Date  . Anxiety   . Chronic cystitis   . Degenerative disc disease, cervical   . Fibromyalgia   . GERD (gastroesophageal reflux disease)   . Glaucoma   . Hyperlipidemia   . Interstitial lung disease (HCC)   . Kidney disease   . Osteoarthritis of both knees   . Osteoporosis   . Sjogren's disease (HCC)   . Systemic lupus erythematosus (HCC)   . Thyroid disease   . TIA (transient ischemic attack)   . Torticollis   . Venous stasis     Patient Active Problem List   Diagnosis Date Noted  . History of artificial joint 06/26/2015  . Dermatophytosis of groin 06/26/2015  . Encounter for screening for osteoporosis 06/26/2015  . Adiposity 06/26/2015  . OP (osteoporosis) 06/26/2015  . Disorder of kidney 06/26/2015  . ILD (interstitial lung disease) (HCC) 06/26/2015  . Chronic  interstitial cystitis 06/26/2015  . Fibrositis 06/26/2015  . Can't get food down 06/26/2015  . Stasis, venous 05/23/2015  . ASCUS favoring benign 05/23/2015  . Adult BMI 30+ 05/23/2015  . Clinical depression 05/23/2015  . Dermatitis, eczematoid 05/23/2015  . Glaucoma 05/23/2015  . Panaritium of finger 05/23/2015  . Gougerout-Sjoegren syndrome 05/23/2015  . Awareness of heartbeats 02/09/2015  . Acid reflux 01/18/2015  . DDD (degenerative disc disease), cervical 01/17/2015  . Hypercholesteremia 01/17/2015  . Adult hypothyroidism 01/17/2015  . Hypertension 11/16/2014  . Anxiety 10/24/2014  . Carotid artery narrowing 06/26/2014  . Breathlessness on exertion 06/26/2014  . Arterial disease (HCC) 11/10/2013  . Trochanteric bursitis of left hip 10/20/2013  . Thoracic neuritis 10/20/2013  . Chronic kidney disease (CKD), stage III (moderate) (HCC) 07/08/2013  . Personal history of transient ischemic attack (TIA), and cerebral infarction without residual deficits 07/08/2013  . Sicca syndrome (HCC) 07/08/2013  . Sjogren's syndrome (HCC) 07/08/2013  . Systemic lupus erythematosus (HCC) 07/08/2013  . Bladder infection, chronic 03/04/2012  . Incomplete bladder emptying 03/04/2012  . Disorder of sacrum 12/23/2011  . DDD (degenerative disc disease), lumbar 01/18/2011  . Undifferentiated connective tissue disease (HCC) 01/18/2011  . Diffuse connective tissue disease (HCC) 01/18/2011  . Disease of lung 01/18/2011  . Chronic restrictive lung disease 01/18/2011  . Arthritis, degenerative 01/18/2011  . Polymyositis (HCC) 01/18/2011    Past Surgical History:  Procedure Laterality Date  . ABDOMINAL HYSTERECTOMY  1984  Total;Menorrhagia. B oophorectomy  . APPENDECTOMY  1982  . CYSTOSCOPY  2011   Cystitis Follicularis  . LEEP  2003   at [facility]  . LUMBAR DISC SURGERY  1994  . TOTAL KNEE ARTHROPLASTY Bilateral 2006    OB History   None      Home Medications    Prior to  Admission medications   Medication Sig Start Date End Date Taking? Authorizing Provider  acetaminophen (TYLENOL) 500 MG tablet Take 650 mg by mouth.   Yes [provider]  aspirin 325 MG tablet Take by mouth.   Yes [provider]  Biotin (SUPER BIOTIN) 5 MG TABS Take by mouth.   Yes [provider]  Calcium Carbonate-Vitamin D 600-200 MG-UNIT CAPS Take by mouth.   Yes [provider]  clonazePAM (KLONOPIN) 0.5 MG tablet TAKE 1 TABLET BY MOUTH TWICE DAILY AS NEEDED FOR ANXIETY 05/18/15  Yes Lorie Phenix, MD  Coenzyme Q10 (CO Q 10) 100 MG CAPS Take by mouth.   Yes [provider]  desvenlafaxine (PRISTIQ) 50 MG 24 hr tablet Take 50 mg by mouth daily.   Yes [provider]  diclofenac sodium (VOLTAREN) 1 % GEL Apply 2 g topically 4 (four) times daily. 02/05/17 02/05/18 Yes [provider]  Docusate Calcium (STOOL SOFTENER PO) Take 2 tablets by mouth daily.   Yes [provider]  doxepin (SINEQUAN) 50 MG capsule Take by mouth.   Yes [provider]  ferrous sulfate 325 (65 FE) MG tablet Take 325 mg by mouth daily.   Yes [provider]  hydrochlorothiazide (MICROZIDE) 12.5 MG capsule Take 1 capsule by mouth daily. 09/08/17  Yes [provider]  hydroxychloroquine (PLAQUENIL) 200 MG tablet Take by mouth.   Yes [provider]  latanoprost (XALATAN) 0.005 % ophthalmic solution Apply to eye.   Yes [provider]  levothyroxine (SYNTHROID, LEVOTHROID) 88 MCG tablet TAKE 1 TABLET BY MOUTH ONCE DAILY ON AN EMPTY STOMACH, WAIT 30 MINS BEFORE TAKING OTHER MEDS 01/05/17  Yes Malva Limes, MD  Multiple Vitamin (MULTIVITAMIN) tablet Take 1 tablet by mouth daily.   Yes [provider]  nadolol (CORGARD) 20 MG tablet Take 20 mg by mouth.   Yes [provider]  Omega-3 Fatty Acids (FISH OIL) 1200 MG CAPS Take 1 capsule by mouth daily.   Yes [provider]  omeprazole  (PRILOSEC) 20 MG capsule TAKE 1 CAPSULE BY MOUTH DAILY 03/13/16  Yes Malva Limes, MD  traMADol (ULTRAM) 50 MG tablet Take 1 tablet by mouth 2 (two) times daily as needed. 03/11/17  Yes [provider]  Vitamin D, Cholecalciferol, 1000 units CAPS Take by mouth.   Yes [provider]  azithromycin (ZITHROMAX) 250 MG tablet Take 1 tablet (250 mg total) by mouth daily. Take first 2 tablets together, then 1 every day until finished. 10/29/17   Yisrael Obryan C, PA-C  cetirizine HCl (ZYRTEC) 1 MG/ML solution Take 5 mLs (5 mg total) by mouth daily. 10/29/17   Tannen Vandezande C, PA-C  cyanocobalamin 100 MCG tablet Take by mouth.    [provider]  fluconazole (DIFLUCAN) 150 MG tablet Take 1 tablet (150 mg total) by mouth once for 1 dose. 10/29/17 10/29/17  Nekeya Briski C, PA-C  guaiFENesin-dextromethorphan (ROBITUSSIN DM) 100-10 MG/5ML syrup Take 5 mLs by mouth every 6 (six) hours as needed for cough. 10/29/17   Brinkley Peet C, PA-C  HYDROcodone-acetaminophen (NORCO/VICODIN) 5-325 MG tablet  04/16/14  [provider]  pyridOXINE (VITAMIN B-6) 100 MG tablet Take by mouth.    [provider]    Family History Family History  Problem Relation Age of Onset  . Stroke Mother   . Hypertension Mother   . Diabetes Mother   . Bell's palsy Father     Social History Social History   Tobacco Use  . Smoking status: Never Smoker  . Smokeless tobacco: Never Used  Substance Use Topics  . Alcohol use: No  . Drug use: No     Allergies   Cortisone; Loteprednol etabonate; Sulfur; Fluticasone; Lovastatin; Montelukast; Olopatadine; Prednisone; Shellfish allergy; Simvastatin; Sulfa antibiotics; and Codeine   Review of Systems Review of Systems  Constitutional: Negative for activity change, appetite change, chills, fatigue and fever.  HENT: Positive for congestion, rhinorrhea and sore throat. Negative for ear pain, sinus pressure and trouble swallowing.     Eyes: Negative for discharge and redness.  Respiratory: Positive for cough and shortness of breath. Negative for chest tightness.   Cardiovascular: Negative for chest pain.  Gastrointestinal: Negative for abdominal pain, diarrhea, nausea and vomiting.  Musculoskeletal: Positive for arthralgias and myalgias.  Skin: Negative for rash.  Neurological: Negative for dizziness, light-headedness and headaches.     Physical Exam Triage Vital Signs ED Triage Vitals [10/29/17 1622]  Enc Vitals Group     BP 110/62     Pulse Rate 78     Resp 16     Temp 98.1 F (36.7 C)     Temp Source Oral     SpO2 97 %     Weight 149 lb (67.6 kg)     Height 5\' 4"  (1.626 m)     Head Circumference      Peak Flow      Pain Score 10     Pain Loc      Pain Edu?      Excl. in GC?    No data found.  Updated Vital Signs BP 110/62 (BP Location: Left Arm)   Pulse 78   Temp 98.1 F (36.7 C) (Oral)   Resp 16   Ht 5\' 4"  (1.626 m)   Wt 149 lb (67.6 kg)   SpO2 97%   BMI 25.58 kg/m   Visual Acuity Right Eye Distance:   Left Eye Distance:   Bilateral Distance:    Right Eye Near:   Left Eye Near:    Bilateral Near:     Physical Exam  Constitutional: She is oriented to person, place, and time. She appears well-developed and well-nourished. No distress.  HENT:  Head: Normocephalic and atraumatic.  Bilateral ears without tenderness to palpation of external auricle, tragus and mastoid, EAC's without erythema or swelling, TM's with good bony landmarks and cone of light. Non erythematous.  Oral mucosa pink and moist, no tonsillar enlargement or exudate. Posterior pharynx patent and erythematous-signs of postnasal drainage, no uvula deviation or swelling. Normal phonation.   Eyes: Pupils are equal, round, and reactive to light. Conjunctivae and EOM are normal.  Neck: Neck supple.  Cardiovascular: Normal rate and regular rhythm.  No murmur heard. Pulmonary/Chest: Effort normal. No respiratory distress.   Slight crackles throughout lungs, occasional faint inconsistent expiratory wheezing  Abdominal: Soft. There is no tenderness.  Musculoskeletal: She exhibits no edema.  Holding neck in slight leftward flexion  Neurological: She is alert and oriented to person, place, and time.  Strength 5/5 at shoulders, hips and knees in all directions bilaterally Cranial nerves II through XII  grossly intact Bilateral total knee replacement, patellar reflexes difficult to obtain  Skin: Skin is warm and dry.  Psychiatric: She has a normal mood and affect.  Nursing note and vitals reviewed.    UC Treatments / Results  Labs (all labs ordered are listed, but only abnormal results are displayed) Labs Reviewed - No data to display  EKG None  Radiology No results found.  Procedures Procedures (including critical care time)  Medications Ordered in UC Medications - No data to display  Initial Impression / Assessment and Plan / UC Course  I have reviewed the triage vital signs and the nursing notes.  Pertinent labs & imaging results that were available during my care of the patient were reviewed by me and considered in my medical decision making (see chart for details).     Patient with URI symptoms for 5 days, history of interstitial lung disease and mild wheezing, will treat for bronchitis.  Will provide azithromycin, recommend 5 mL of cetirizine, Robitussin-DM for cough.  States she gets yeast infections after taking antibiotics, will provide Diflucan to take afterward.  Will recommended continue Tylenol and tramadol as needed for arthralgias/myalgias.  Follow-up with PCP/rheumatology if joint pains persisting despite resolution of URI symptoms.  Vital signs stable, patient in no acute distress, no respiratory distress.  No focal neuro deficits.Discussed strict return precautions. Patient verbalized understanding and is agreeable with plan.  Final Clinical Impressions(s) / UC Diagnoses   Final  diagnoses:  Body aches  Acute bronchitis, unspecified organism     Discharge Instructions     Please begin taking azithromycin, 2 tablets today, 1 tablet for the following 4 days Please take 5 mL of a daily allergy pill like Zyrtec or Claritin, I sent this in, we may also get this over-the-counter if it is cheaper, may need to get liquid Please use Robitussin-DM as needed for cough  Please continue to use Tylenol and tramadol as needed for pain Follow-up with rheumatology   ED Prescriptions    Medication Sig Dispense Auth. Provider   azithromycin (ZITHROMAX) 250 MG tablet  (Status: Discontinued) Take 1 tablet (250 mg total) by mouth daily. Take first 2 tablets together, then 1 every day until finished. 6 tablet Orel Cooler C, PA-C   guaiFENesin-dextromethorphan (ROBITUSSIN DM) 100-10 MG/5ML syrup  (Status: Discontinued) Take 5 mLs by mouth every 6 (six) hours as needed for cough. 118 mL Jona Erkkila C, PA-C   cetirizine HCl (ZYRTEC) 1 MG/ML solution Take 5 mLs (5 mg total) by mouth daily. 60 mL Rakesha Dalporto C, PA-C   azithromycin (ZITHROMAX) 250 MG tablet Take 1 tablet (250 mg total) by mouth daily. Take first 2 tablets together, then 1 every day until finished. 6 tablet Joseh Sjogren C, PA-C   guaiFENesin-dextromethorphan (ROBITUSSIN DM) 100-10 MG/5ML syrup Take 5 mLs by mouth every 6 (six) hours as needed for cough. 118 mL Jerrell Hart C, PA-C   fluconazole (DIFLUCAN) 150 MG tablet Take 1 tablet (150 mg total) by mouth once for 1 dose. 2 tablet Tryce Surratt C, PA-C     Controlled Substance Prescriptions [place] Controlled Substance Registry consulted? Not Applicable   Lew Dawes, [place] 10/29/17 1726

## 2017-10-29 NOTE — Discharge Instructions (Signed)
Please begin taking azithromycin, 2 tablets today, 1 tablet for the following 4 days Please take 5 mL of a daily allergy pill like Zyrtec or Claritin, I sent this in, we may also get this over-the-counter if it is cheaper, may need to get liquid Please use Robitussin-DM as needed for cough  Please continue to use Tylenol and tramadol as needed for pain Follow-up with rheumatology

## 2019-10-12 ENCOUNTER — Other Ambulatory Visit: Payer: Self-pay | Admitting: Family

## 2019-10-12 DIAGNOSIS — R0600 Dyspnea, unspecified: Secondary | ICD-10-CM

## 2020-02-27 DEATH — deceased
